# Patient Record
Sex: Male | Born: 1970 | Race: White | Hispanic: No | Smoking: Never smoker
Health system: Southern US, Community
[De-identification: ages and names within clinical notes are randomized; demographics above are authoritative.]

## PROBLEM LIST (undated history)

## (undated) DIAGNOSIS — N200 Calculus of kidney: Secondary | ICD-10-CM

## (undated) DIAGNOSIS — A159 Respiratory tuberculosis unspecified: Secondary | ICD-10-CM

## (undated) DIAGNOSIS — K219 Gastro-esophageal reflux disease without esophagitis: Secondary | ICD-10-CM

## (undated) DIAGNOSIS — D473 Essential (hemorrhagic) thrombocythemia: Secondary | ICD-10-CM

## (undated) DIAGNOSIS — G4730 Sleep apnea, unspecified: Secondary | ICD-10-CM

## (undated) DIAGNOSIS — N201 Calculus of ureter: Secondary | ICD-10-CM

## (undated) DIAGNOSIS — Z880 Allergy status to penicillin: Secondary | ICD-10-CM

## (undated) DIAGNOSIS — H269 Unspecified cataract: Secondary | ICD-10-CM

## (undated) DIAGNOSIS — K5730 Diverticulosis of large intestine without perforation or abscess without bleeding: Secondary | ICD-10-CM

## (undated) DIAGNOSIS — E785 Hyperlipidemia, unspecified: Secondary | ICD-10-CM

## (undated) DIAGNOSIS — I10 Essential (primary) hypertension: Secondary | ICD-10-CM

## (undated) DIAGNOSIS — F909 Attention-deficit hyperactivity disorder, unspecified type: Secondary | ICD-10-CM

## (undated) DIAGNOSIS — K5720 Diverticulitis of large intestine with perforation and abscess without bleeding: Secondary | ICD-10-CM

## (undated) HISTORY — PX: WISDOM TOOTH EXTRACTION: SHX21

## (undated) HISTORY — DX: Respiratory tuberculosis unspecified: A15.9

## (undated) HISTORY — PX: LITHOTRIPSY: SUR834

## (undated) HISTORY — DX: Hyperlipidemia, unspecified: E78.5

## (undated) HISTORY — DX: Gastro-esophageal reflux disease without esophagitis: K21.9

## (undated) HISTORY — PX: COLON SURGERY: SHX602

## (undated) HISTORY — DX: Unspecified cataract: H26.9

## (undated) HISTORY — DX: Sleep apnea, unspecified: G47.30

## (undated) HISTORY — PX: COLONOSCOPY: SHX174

---

## 2011-08-03 ENCOUNTER — Emergency Department (HOSPITAL_COMMUNITY)
Admission: EM | Admit: 2011-08-03 | Discharge: 2011-08-03 | Disposition: A | Discharge status: Home / Self Care | Payer: BC Managed Care – PPO | Attending: Emergency Medicine | Admitting: Emergency Medicine

## 2011-08-03 ENCOUNTER — Other Ambulatory Visit: Payer: Self-pay

## 2011-08-03 ENCOUNTER — Encounter (HOSPITAL_COMMUNITY): Payer: Self-pay | Admitting: *Deleted

## 2011-08-03 DIAGNOSIS — R109 Unspecified abdominal pain: Secondary | ICD-10-CM | POA: Insufficient documentation

## 2011-08-03 DIAGNOSIS — I10 Essential (primary) hypertension: Secondary | ICD-10-CM | POA: Insufficient documentation

## 2011-08-03 DIAGNOSIS — K5730 Diverticulosis of large intestine without perforation or abscess without bleeding: Secondary | ICD-10-CM | POA: Insufficient documentation

## 2011-08-03 DIAGNOSIS — R079 Chest pain, unspecified: Secondary | ICD-10-CM | POA: Insufficient documentation

## 2011-08-03 DIAGNOSIS — Z87442 Personal history of urinary calculi: Secondary | ICD-10-CM | POA: Insufficient documentation

## 2011-08-03 HISTORY — DX: Diverticulosis of large intestine without perforation or abscess without bleeding: K57.30

## 2011-08-03 HISTORY — DX: Essential (primary) hypertension: I10

## 2011-08-03 HISTORY — DX: Calculus of kidney: N20.0

## 2011-08-03 LAB — URINALYSIS, MICROSCOPIC ONLY
Leukocytes, UA: NEGATIVE
Nitrite: POSITIVE — AB
Specific Gravity, Urine: >1.03 — ABNORMAL HIGH (ref 1.005–1.030)
pH: 5 (ref 5.0–8.0)

## 2011-08-03 MED ORDER — SODIUM CHLORIDE 0.9 % IV BOLUS (SEPSIS)
1000.0000 mL | Freq: Once | INTRAVENOUS | Status: AC
  Administered 2011-08-03: 1000 mL via INTRAVENOUS

## 2011-08-03 MED ORDER — KETOROLAC TROMETHAMINE 30 MG/ML IJ SOLN
30.0000 mg | Freq: Once | INTRAMUSCULAR | Status: AC
  Administered 2011-08-03: 30 mg via INTRAVENOUS
  Filled 2011-08-03: qty 1

## 2011-08-03 MED ORDER — ONDANSETRON 4 MG PO TBDP: 4.0000 mg | ORAL_TABLET | Freq: Three times a day (TID) | ORAL | Status: AC | PRN

## 2011-08-03 MED ORDER — CEFDINIR 300 MG PO CAPS: 300.0000 mg | ORAL_CAPSULE | Freq: Two times a day (BID) | ORAL | Status: AC

## 2011-08-03 MED ORDER — HYDROMORPHONE HCL PF 1 MG/ML IJ SOLN
1.0000 mg | Freq: Once | INTRAMUSCULAR | Status: AC
  Administered 2011-08-03: 1 mg via INTRAVENOUS
  Filled 2011-08-03: qty 1

## 2011-08-03 NOTE — ED Provider Notes (Addendum)
History     CSN: [id number]  Arrival date & time 08/03/11  0124   First MD Initiated Contact with Patient 08/03/11 0201      Chief Complaint  Patient presents with  . 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 He also has a history of kidney stones including a right distal ureteral kidney stone that he states was 3 mm big and identified in February as well. He states that since that time over the last 3 months he has developed intermittent right-sided flank pain which is sharp and stabbing, radiates to the right lower quadrant and resolves by itself. It comes in waves, waxes and wanes in intensity, a sharp and stabbing and sometimes associated with nausea. He states that he does get some intermittent cystitis type symptoms related to the diverticulitis but has not had a urinary infection. He has not seen a urologist in this area but has been referred to a urologist in the ED and. He has not been seen in the office, has not had cystoscopy and has not had any lithotripsy for this kidney stone. He states that over the last several hours he has had recurrence of his pain on the right flank, it is typical for his kidney stone pain and he has not had medications nor nausea prior to arrival.  He also states that he has had intermittent chest pain when he gets stressed out, his stress level has been significantly increased lately at work and he states that he's been having intermittent chest pain related to stress. It is nonexertional positional or related to deep breathing or eating. Currently he does not have any chest pain. He does admit to having a history of SVT with stress as well as being in a junctional rhythm occasionally. He has no history of coronary  obstructive disease or other primary arrhythmia.  Patient is a 41 y.o. male presenting with flank pain. The history is provided by the patient.  Flank Pain    Past Medical History  Diagnosis Date  . Kidney calculi   . Diverticula of colon   . Hypertension     History reviewed. No pertinent past surgical history.  No family history on file.  History  Substance Use Topics  . Smoking status: Never Smoker   . Smokeless tobacco: Not on file  . Alcohol Use: No      Review of Systems  Genitourinary: Positive for flank pain.  All other systems reviewed and are negative.    Allergies  Penicillins  Home Medications   Current Outpatient Rx  Name Route Sig Dispense Refill  . AMPHETAMINE-DEXTROAMPHETAMINE 30 MG PO TABS Oral Take 30 mg by mouth 2 (two) times daily.    . CYCLOBENZAPRINE HCL 5 MG PO TABS Oral Take 5 mg by mouth 3 (three) times daily as needed.    Marland Kitchen DOCUSATE SODIUM 100 MG PO CAPS Oral Take 100 mg by mouth 2 (two) times daily as needed.    Marland Kitchen HYDROCODONE-ACETAMINOPHEN 10-500 MG PO TABS Oral Take 1 tablet by mouth every 6 (six) hours as needed.    Marland Kitchen LOSARTAN POTASSIUM 50 MG PO TABS Oral Take 50 mg by mouth daily.    Marland Kitchen OMEPRAZOLE 20 MG PO CPDR Oral Take 20 mg by mouth daily.    Marland Kitchen PERCOCET PO Oral Take by mouth.    Marland Kitchen  PROPRANOLOL HCL 80 MG PO TABS Oral Take 80 mg by mouth 2 (two) times daily.    Marland Kitchen TAMSULOSIN HCL 0.4 MG PO CAPS Oral Take 0.4 mg by mouth.    . CEFDINIR 300 MG PO CAPS Oral Take 1 capsule (300 mg total) by mouth 2 (two) times daily. 20 capsule 0  . ONDANSETRON 4 MG PO TBDP Oral Take 1 tablet (4 mg total) by mouth every 8 (eight) hours as needed for nausea. 10 tablet 0    BP 138/91  Pulse 83  Temp(Src) 97.6 F (36.4 C) (Oral)  Resp 16  Ht 6\' 4"  (1.93 m)  Wt 210 lb (95.255 kg)  BMI 25.56 kg/m2  SpO2 100%  Physical Exam  Nursing note and vitals reviewed. Constitutional: He appears well-developed and well-nourished. No distress.  HENT:  Head:  Normocephalic and atraumatic.  Mouth/Throat: Oropharynx is clear and moist. No oropharyngeal exudate.  Eyes: Conjunctivae and EOM are normal. Pupils are equal, round, and reactive to light. Right eye exhibits no discharge. Left eye exhibits no discharge. No scleral icterus.  Neck: Normal range of motion. Neck supple. No JVD present. No thyromegaly present.  Cardiovascular: Normal rate, regular rhythm, normal heart sounds and intact distal pulses.  Exam reveals no gallop and no friction rub.   No murmur heard. Pulmonary/Chest: Effort normal and breath sounds normal. No respiratory distress. He has no wheezes. He has no rales.  Abdominal: Soft. Bowel sounds are normal. He exhibits no distension and no mass. There is no tenderness.       Mild CVA tenderness on the right  Musculoskeletal: Normal range of motion. He exhibits no edema and no tenderness.  Lymphadenopathy:    He has no cervical adenopathy.  Neurological: He is alert. Coordination normal.  Skin: Skin is warm and dry. No rash noted. No erythema.  Psychiatric: He has a normal mood and affect. His behavior is normal.    ED Course  Procedures (including critical care time)  Labs Reviewed  URINALYSIS, WITH MICROSCOPIC - Abnormal; Notable for the following:    Color, Urine RED (*) BIOCHEMICALS MAY BE AFFECTED BY COLOR   Specific Gravity, Urine >1.030 (*)    Glucose, UA 250 (*)    Ketones, ur TRACE (*)    Protein, ur 100 (*)    Urobilinogen, UA >8.0 (*)    Nitrite POSITIVE (*)    Crystals CA OXALATE CRYSTALS (*)    All other components within normal limits   No results found.   1. Flank pain       MDM  The patient is well-appearing, he has vital signs that are normal, he has mild CVA tenderness but no other urinary symptoms. He admits to taking Pyridium and states that his urine has been slightly discolored since starting that. We'll check urinalysis for hematuria, infection, pain medication. He does not have a surgical  abdomen, has no tenderness in his abdomen and has no fevers chills nausea or vomiting.  Symptoms much improved after medicines - review of UA shows nitrite positive but rare bacteria and 0-2 WBC - culture sent - pt requests cephalosporin which he states he takes safely to treat possible diverticulitis flare -has f/u with GI in [place] and surgery in [place] - is well appearing at this time, normal VS, and tolerating PO.  Has percocet and Flomax at home, recommendations given, local Urology f/u recommended  ED ECG REPORT   Date: 08/03/2011   Rate: 75  Rhythm: normal sinus rhythm  QRS  Axis: normal  Intervals: normal  ST/T Wave abnormalities: early repolarization  Conduction Disutrbances:none  Narrative Interpretation:   Old EKG Reviewed: none available  Doubt cardiac problems given ECG    Discharge Prescriptions include:  zofran Cefdinir   Vida Roller, MD 08/03/11 1610  Vida Roller, MD 08/03/11 [phone]

## 2011-08-03 NOTE — ED Notes (Addendum)
Pt reports history of kidney stones, reports severe right flank pain as though stone is moving.   States that he did take one Percocet 10 about 11pm, with minor relief.  Pt also reporting history of diverticulitis and cystitis.  Also reporting cardiac history and tachycardia with severe stress and pain. HR normal at this time.  Dr. Hyacinth Meeker at bedside.

## 2011-08-03 NOTE — Discharge Instructions (Signed)
Call Dr. Jerre Simon today - take omnicef twice daily in case part of your symptoms are related to a flare of diverticulitis, your urine culture has been sent - you will be contacted if you need a different antibiotic  Return to the hospital for severe or worsening vomiting, fever, pain.

## 2011-08-03 NOTE — ED Notes (Signed)
Pt reports dx of kidney stone tonight pain in rt flank worse

## 2011-08-04 LAB — URINE CULTURE
Colony Count: NO GROWTH
Culture  Setup Time: 201305081112
Culture: NO GROWTH

## 2011-08-18 ENCOUNTER — Encounter (HOSPITAL_COMMUNITY): Payer: Self-pay | Admitting: Emergency Medicine

## 2011-08-18 ENCOUNTER — Emergency Department (HOSPITAL_COMMUNITY): Payer: BC Managed Care – PPO

## 2011-08-18 ENCOUNTER — Inpatient Hospital Stay (HOSPITAL_COMMUNITY)
Admission: EM | Admit: 2011-08-18 | Discharge: 2011-08-23 | DRG: 182 | Disposition: A | Discharge status: Other Acute Inpatient Hospital | Payer: BC Managed Care – PPO | Attending: Internal Medicine | Admitting: Internal Medicine

## 2011-08-18 DIAGNOSIS — F988 Other specified behavioral and emotional disorders with onset usually occurring in childhood and adolescence: Secondary | ICD-10-CM

## 2011-08-18 DIAGNOSIS — D6489 Other specified anemias: Secondary | ICD-10-CM | POA: Diagnosis present

## 2011-08-18 DIAGNOSIS — I10 Essential (primary) hypertension: Secondary | ICD-10-CM | POA: Diagnosis present

## 2011-08-18 DIAGNOSIS — K630 Abscess of intestine: Secondary | ICD-10-CM | POA: Diagnosis present

## 2011-08-18 DIAGNOSIS — K651 Peritoneal abscess: Secondary | ICD-10-CM

## 2011-08-18 DIAGNOSIS — K5732 Diverticulitis of large intestine without perforation or abscess without bleeding: Principal | ICD-10-CM | POA: Diagnosis present

## 2011-08-18 DIAGNOSIS — D473 Essential (hemorrhagic) thrombocythemia: Secondary | ICD-10-CM | POA: Diagnosis present

## 2011-08-18 DIAGNOSIS — Z8669 Personal history of other diseases of the nervous system and sense organs: Secondary | ICD-10-CM

## 2011-08-18 DIAGNOSIS — N201 Calculus of ureter: Secondary | ICD-10-CM | POA: Diagnosis present

## 2011-08-18 DIAGNOSIS — K4090 Unilateral inguinal hernia, without obstruction or gangrene, not specified as recurrent: Secondary | ICD-10-CM | POA: Diagnosis present

## 2011-08-18 DIAGNOSIS — E871 Hypo-osmolality and hyponatremia: Secondary | ICD-10-CM | POA: Diagnosis present

## 2011-08-18 DIAGNOSIS — K5792 Diverticulitis of intestine, part unspecified, without perforation or abscess without bleeding: Secondary | ICD-10-CM

## 2011-08-18 DIAGNOSIS — G43909 Migraine, unspecified, not intractable, without status migrainosus: Secondary | ICD-10-CM | POA: Diagnosis present

## 2011-08-18 DIAGNOSIS — N1330 Unspecified hydronephrosis: Secondary | ICD-10-CM | POA: Diagnosis present

## 2011-08-18 DIAGNOSIS — K5720 Diverticulitis of large intestine with perforation and abscess without bleeding: Secondary | ICD-10-CM

## 2011-08-18 DIAGNOSIS — Z880 Allergy status to penicillin: Secondary | ICD-10-CM

## 2011-08-18 DIAGNOSIS — R1032 Left lower quadrant pain: Secondary | ICD-10-CM | POA: Diagnosis present

## 2011-08-18 DIAGNOSIS — R300 Dysuria: Secondary | ICD-10-CM | POA: Diagnosis present

## 2011-08-18 HISTORY — DX: Calculus of ureter: N20.1

## 2011-08-18 HISTORY — DX: Diverticulitis of large intestine with perforation and abscess without bleeding: K57.20

## 2011-08-18 HISTORY — DX: Attention-deficit hyperactivity disorder, unspecified type: F90.9

## 2011-08-18 HISTORY — DX: Allergy status to penicillin: Z88.0

## 2011-08-18 HISTORY — DX: Essential (hemorrhagic) thrombocythemia: D47.3

## 2011-08-18 LAB — CBC
MCH: 28.1 pg (ref 26.0–34.0)
Platelets: 704 10*3/uL — ABNORMAL HIGH (ref 150–400)
RBC: 4.73 MIL/uL (ref 4.22–5.81)
RDW: 13.7 % (ref 11.5–15.5)
WBC: 15.8 10*3/uL — ABNORMAL HIGH (ref 4.0–10.5)

## 2011-08-18 LAB — BASIC METABOLIC PANEL
Calcium: 10.1 mg/dL (ref 8.4–10.5)
GFR calc Af Amer: 84 mL/min — ABNORMAL LOW (ref >90)
GFR calc non Af Amer: 72 mL/min — ABNORMAL LOW (ref >90)
Glucose, Bld: 107 mg/dL — ABNORMAL HIGH (ref 70–99)
Potassium: 4.1 mEq/L (ref 3.5–5.1)
Sodium: 135 mEq/L (ref 135–145)

## 2011-08-18 LAB — URINALYSIS, ROUTINE W REFLEX MICROSCOPIC
Leukocytes, UA: NEGATIVE
Nitrite: NEGATIVE
Protein, ur: NEGATIVE mg/dL
Specific Gravity, Urine: 1.015 (ref 1.005–1.030)
Urobilinogen, UA: 0.2 mg/dL (ref 0.0–1.0)

## 2011-08-18 LAB — DIFFERENTIAL
Basophils Absolute: 0 10*3/uL (ref 0.0–0.1)
Eosinophils Absolute: 0.4 10*3/uL (ref 0.0–0.7)
Lymphs Abs: 3.7 10*3/uL (ref 0.7–4.0)
Neutrophils Relative %: 67 % (ref 43–77)

## 2011-08-18 LAB — URINE MICROSCOPIC-ADD ON

## 2011-08-18 LAB — HEPATIC FUNCTION PANEL
Bilirubin, Direct: <0.1 mg/dL (ref 0.0–0.3)
Total Bilirubin: 0.3 mg/dL (ref 0.3–1.2)

## 2011-08-18 LAB — LIPASE, BLOOD: Lipase: 29 U/L (ref 11–59)

## 2011-08-18 MED ORDER — METRONIDAZOLE IN NACL 5-0.79 MG/ML-% IV SOLN
500.0000 mg | Freq: Once | INTRAVENOUS | Status: AC
  Administered 2011-08-18: 500 mg via INTRAVENOUS
  Filled 2011-08-18: qty 100

## 2011-08-18 MED ORDER — HYDROMORPHONE HCL PF 1 MG/ML IJ SOLN
1.0000 mg | INTRAMUSCULAR | Status: DC | PRN
  Administered 2011-08-18: 1 mg via INTRAVENOUS
  Filled 2011-08-18: qty 1

## 2011-08-18 MED ORDER — HYDROMORPHONE HCL PF 1 MG/ML IJ SOLN
1.0000 mg | Freq: Once | INTRAMUSCULAR | Status: AC
  Administered 2011-08-18: 1 mg via INTRAVENOUS
  Filled 2011-08-18: qty 1

## 2011-08-18 MED ORDER — ONDANSETRON HCL 4 MG/2ML IJ SOLN
4.0000 mg | Freq: Four times a day (QID) | INTRAMUSCULAR | Status: DC | PRN
  Administered 2011-08-19 – 2011-08-23 (×10): 4 mg via INTRAVENOUS
  Filled 2011-08-18 (×10): qty 2

## 2011-08-18 MED ORDER — ONDANSETRON HCL 4 MG/2ML IJ SOLN
4.0000 mg | Freq: Once | INTRAMUSCULAR | Status: AC
  Administered 2011-08-18: 4 mg via INTRAVENOUS
  Filled 2011-08-18: qty 2

## 2011-08-18 MED ORDER — CEFTRIAXONE SODIUM 1 G IJ SOLR
1.0000 g | INTRAMUSCULAR | Status: DC
  Administered 2011-08-18 – 2011-08-22 (×5): 1 g via INTRAVENOUS
  Filled 2011-08-18 (×5): qty 10

## 2011-08-18 MED ORDER — POTASSIUM CHLORIDE IN NACL 20-0.9 MEQ/L-% IV SOLN
INTRAVENOUS | Status: DC
  Administered 2011-08-18 – 2011-08-21 (×6): via INTRAVENOUS

## 2011-08-18 MED ORDER — SODIUM CHLORIDE 0.9 % IV SOLN: INTRAVENOUS | Status: DC

## 2011-08-18 MED ORDER — TAMSULOSIN HCL 0.4 MG PO CAPS
0.4000 mg | ORAL_CAPSULE | Freq: Every day | ORAL | Status: DC
  Administered 2011-08-18 – 2011-08-22 (×5): 0.4 mg via ORAL
  Filled 2011-08-18 (×6): qty 1

## 2011-08-18 MED ORDER — ONDANSETRON HCL 4 MG PO TABS
4.0000 mg | ORAL_TABLET | Freq: Four times a day (QID) | ORAL | Status: DC | PRN
  Administered 2011-08-23: 4 mg via ORAL
  Filled 2011-08-18: qty 1

## 2011-08-18 MED ORDER — CIPROFLOXACIN IN D5W 400 MG/200ML IV SOLN
400.0000 mg | Freq: Once | INTRAVENOUS | Status: AC
  Administered 2011-08-18: 400 mg via INTRAVENOUS
  Filled 2011-08-18: qty 200

## 2011-08-18 MED ORDER — PROPRANOLOL HCL 20 MG PO TABS
80.0000 mg | ORAL_TABLET | Freq: Two times a day (BID) | ORAL | Status: DC
  Administered 2011-08-18 – 2011-08-21 (×3): 80 mg via ORAL
  Filled 2011-08-18 (×4): qty 4
  Filled 2011-08-18: qty 1

## 2011-08-18 MED ORDER — SODIUM CHLORIDE 0.9 % IV BOLUS (SEPSIS)
1000.0000 mL | Freq: Once | INTRAVENOUS | Status: AC
  Administered 2011-08-18: 1000 mL via INTRAVENOUS

## 2011-08-18 MED ORDER — ALUM & MAG HYDROXIDE-SIMETH 200-200-20 MG/5ML PO SUSP: 30.0000 mL | Freq: Four times a day (QID) | ORAL | Status: DC | PRN

## 2011-08-18 MED ORDER — ONDANSETRON HCL 4 MG/2ML IJ SOLN
4.0000 mg | Freq: Three times a day (TID) | INTRAMUSCULAR | Status: DC | PRN
  Administered 2011-08-18: 4 mg via INTRAVENOUS
  Filled 2011-08-18: qty 2

## 2011-08-18 MED ORDER — HEPARIN SODIUM (PORCINE) 5000 UNIT/ML IJ SOLN
5000.0000 [IU] | Freq: Three times a day (TID) | INTRAMUSCULAR | Status: DC
  Administered 2011-08-18 – 2011-08-19 (×3): 5000 [IU] via SUBCUTANEOUS
  Filled 2011-08-18 (×3): qty 1

## 2011-08-18 MED ORDER — IBUPROFEN 800 MG PO TABS: 800.0000 mg | ORAL_TABLET | Freq: Once | ORAL | Status: DC

## 2011-08-18 MED ORDER — IOHEXOL 300 MG/ML  SOLN
100.0000 mL | Freq: Once | INTRAMUSCULAR | Status: AC | PRN
  Administered 2011-08-18: 100 mL via INTRAVENOUS

## 2011-08-18 MED ORDER — HYDROMORPHONE HCL PF 1 MG/ML IJ SOLN
1.0000 mg | INTRAMUSCULAR | Status: DC | PRN
  Administered 2011-08-18 – 2011-08-19 (×4): 1 mg via INTRAVENOUS
  Filled 2011-08-18 (×4): qty 1

## 2011-08-18 MED ORDER — METRONIDAZOLE IN NACL 5-0.79 MG/ML-% IV SOLN
500.0000 mg | Freq: Three times a day (TID) | INTRAVENOUS | Status: DC
  Administered 2011-08-18 – 2011-08-23 (×14): 500 mg via INTRAVENOUS
  Filled 2011-08-18 (×16): qty 100

## 2011-08-18 MED ORDER — ACETAMINOPHEN 650 MG RE SUPP: 650.0000 mg | Freq: Four times a day (QID) | RECTAL | Status: DC | PRN

## 2011-08-18 MED ORDER — ACETAMINOPHEN 325 MG PO TABS
650.0000 mg | ORAL_TABLET | Freq: Four times a day (QID) | ORAL | Status: DC | PRN
  Administered 2011-08-19 – 2011-08-23 (×6): 650 mg via ORAL
  Filled 2011-08-18 (×6): qty 2

## 2011-08-18 NOTE — H&P (Addendum)
Hospital Admission Note Date: 08/18/2011  Patient name: Wesley Henson Medical record number: [id number] Date of birth: 21-Dec-1970 Age: 41 y.o. Gender: male PCP: Maximiano Coss, MD, MD  Attending physician: Christiane Ha, MD  Chief Complaint:   History of Present Illness: LLQ pain  Wesley Henson is an 41 y.o. male with history of diverticulitis diagnosed in January who presents with left lower quadrant pain. He had outpatient treatment of diverticulitis in January with ciprofloxacin and metronidazole. He was subsequently admitted to [facility] with complicated diverticulitis. He had abscess formation and was treated medically. He received IV ciprofloxacin and Flagyl in the hospital, went home with a PICC line, had IV ciprofloxacin and Flagyl at home which was subsequently changed to Tygacil. He never completely recovered and had mild abdominal pain since then. However, his left lower cord or pain has become much more severe. His appetite has been poor the entire time and he's lost about 40 pounds. He has sometimes blood-tinged stool with mucus. His chronic hemorrhoids and feels that the blood is related to this. He was evaluated by the surgeon at [facility]. He went to [facility] to see a gastroenterologist who agreed with management as far. Patient has had no nausea or vomiting. He has had several CAT scans since January. Today's CAT scan of the abdomen and pelvis shows diverticulitis with 2 small abscess these versus one communicating abscess. I spoke with Dr. Juan Quam who compared the CAT scan to one done at [facility]. The location of the abscess about the same, but the size is a bit bigger since March. There is no free air. The abscess he has are too small and poorly positioned for percutaneous drainage according to radiology. The patient was hoping to avoid surgery. Prior to January, he has never had diverticulitis. He's never had a colonoscopy.    Also, he reports aggressive activities with an adult toy with his husband in December, and wonders whether this may have contributed. He also reports protected sex with a different partner last fall, and is concerned about HIV or other STDs. He tested negative for HIV last September.  In addition, he has a history of nephrolithiasis and had lithotripsy over a week ago in [place]. He has a followup appointment scheduled next week with his urologist.  Past Medical History  Diagnosis Date  . Kidney calculi   . Diverticula of colon   . Hypertension    ADD History of migraines  Meds: Prescriptions prior to admission  Medication Sig Dispense Refill  . amphetamine-dextroamphetamine (ADDERALL) 30 MG tablet Take 30 mg by mouth 2 (two) times daily.      . cyclobenzaprine (FLEXERIL) 5 MG tablet Take 5 mg by mouth 3 (three) times daily as needed. Muscle spasm      . docusate sodium (COLACE) 100 MG capsule Take 100 mg by mouth 2 (two) times daily as needed. constipation      . HYDROcodone-acetaminophen (LORTAB) 10-500 MG per tablet Take 1 tablet by mouth every 6 (six) hours as needed. pain      . losartan (COZAAR) 50 MG tablet Take 50 mg by mouth daily.      . mesalamine (ASACOL) 400 MG EC tablet Take 800 mg by mouth daily.      Marland Kitchen omeprazole (PRILOSEC) 20 MG capsule Take 20 mg by mouth daily.      Marland Kitchen oxyCODONE-acetaminophen (PERCOCET) 10-325 MG per tablet Take 1 tablet by mouth every 6 (six) hours as needed. pain      .  Probiotic Product (PHILLIPS COLON HEALTH) CAPS Take 1 capsule by mouth daily.      . propranolol (INDERAL) 80 MG tablet Take 80 mg by mouth 2 (two) times daily.      . Tamsulosin HCl (FLOMAX) 0.4 MG CAPS Take 0.4 mg by mouth daily.         Allergies: Penicillins, anaphylaxis  Social History: Homosexual, denies heavy drinking or drugs or alcohol  History reviewed. No pertinent family history. Past Surgical History  Procedure Date  . Mouth surgery   . Lithotripsy      Review of Systems: Gen.: As above. No fevers or chills HEENT no headache recently no sore throat swallowing difficulties thrush Respiratory no cough or shortness of breath Cardiovascular no chest pains or palpitations GI as above  GU: He reports frequent dysuria. No genital lesions or purulent urethral drainage. He reports he has to wear depends at work because of her urge incontinence since January Hematologic no history of bleeding or blood clots Endocrine no diabetes Skin no rash Neurologic no history of seizures Psychiatric no depression Musculoskeletal no arthralgias or myalgias  Physical Exam: Blood pressure 136/91, pulse 88, temperature 97.8 F (36.6 C), temperature source Oral, resp. rate 18, height 6\' 4"  (1.93 m), weight 93.895 kg (207 lb), SpO2 97.00%.  BP 136/91  Pulse 88  Temp(Src) 97.8 F (36.6 C) (Oral)  Resp 18  Ht 6\' 4"  (1.93 m)  Wt 93.895 kg (207 lb)  BMI 25.20 kg/m2  SpO2 97%  General Appearance:    Alert, cooperative, no distress, appears stated age  Head:    Normocephalic, without obvious abnormality, atraumatic  Eyes:    PERRL, conjunctiva/corneas clear, EOM's intact, fundi    benign, both eyes       Ears:    Normal TM's and external ear canals, both ears  Nose:   Nares normal, septum midline, mucosa normal, no drainage   or sinus tenderness  Throat:   Lips, mucosa, and tongue normal; teeth and gums normal  Neck:   Supple, symmetrical, trachea midline, no adenopathy;       thyroid:  No enlargement/tenderness/nodules; no carotid   bruit or JVD  Back:     Symmetric, no curvature, ROM normal, right-sided CVA tenderness present   Lungs:     Clear to auscultation bilaterally, respirations unlabored  Chest wall:    No tenderness or deformity  Heart:    Regular rate and rhythm, S1 and S2 normal, no murmur, rub   or gallop  Abdomen:     Soft, decreased bowel sounds. Left lower quadrant tenderness. Mild right sided abdominal tenderness. No rebound  tenderness   Genitalia:   deferred   Rectal:   deferred   Extremities:   Extremities normal, atraumatic, no cyanosis or edema  Pulses:   2+ and symmetric all extremities  Skin:   Skin color, texture, turgor normal, no rashes or lesions  Lymph nodes:   Cervical, supraclavicular, and axillary nodes normal  Neurologic:   CNII-XII intact. Normal strength, sensation and reflexes      throughout    Psychiatric: normal affect  Lab results: Basic Metabolic Panel:  Basename 08/18/11 0714  NA 135  K 4.1  CL 98  CO2 27  GLUCOSE 107*  BUN 11  CREATININE 1.21  CALCIUM 10.1  MG --  PHOS --   Liver Function Tests:  [facility] 08/18/11 0714  AST 30  ALT 24  ALKPHOS 103  BILITOT 0.3  PROT 8.6*  ALBUMIN 4.2  Basename 08/18/11 0714  LIPASE 29  AMYLASE --   No results found for this basename: AMMONIA:2 in the last 72 hours CBC:  Basename 08/18/11 0714  WBC 15.8*  NEUTROABS 10.6*  HGB 13.3  HCT 40.7  MCV 86.0  PLT 704*   Cardiac Enzymes: No results found for this basename: CKTOTAL:3,CKMB:3,CKMBINDEX:3,TROPONINI:3 in the last 72 hours BNP: No results found for this basename: PROBNP:3 in the last 72 hours D-Dimer: No results found for this basename: DDIMER:2 in the last 72 hours CBG: No results found for this basename: GLUCAP:6 in the last 72 hours Hemoglobin A1C: No results found for this basename: HGBA1C in the last 72 hours Fasting Lipid Panel: No results found for this basename: CHOL,HDL,LDLCALC,TRIG,CHOLHDL,LDLDIRECT in the last 72 hours Thyroid Function Tests: No results found for this basename: TSH,T4TOTAL,FREET4,T3FREE,THYROIDAB in the last 72 hours Anemia Panel: No results found for this basename: VITAMINB12,FOLATE,FERRITIN,TIBC,IRON,RETICCTPCT in the last 72 hours Coagulation: No results found for this basename: LABPROT:2,INR:2 in the last 72 hours Urine Drug Screen: Drugs of Abuse  No results found for this basename: labopia, cocainscrnur, labbenz,  amphetmu, thcu, labbarb    Alcohol Level: No results found for this basename: ETH:2 in the last 72 hours Urinalysis:  Basename 08/18/11 0715  COLORURINE YELLOW  LABSPEC 1.015  PHURINE 6.0  GLUCOSEU NEGATIVE  HGBUR TRACE*  BILIRUBINUR NEGATIVE  KETONESUR NEGATIVE  PROTEINUR NEGATIVE  UROBILINOGEN 0.2  NITRITE NEGATIVE  LEUKOCYTESUR NEGATIVE    Imaging results:  Ct Abdomen Pelvis W Contrast  08/18/2011  *RADIOLOGY REPORT*  Clinical Data: Left side abdominal pain, history diverticulitis and kidney stones  CT ABDOMEN AND PELVIS WITH CONTRAST  Technique:  Multidetector CT imaging of the abdomen and pelvis was performed following the standard protocol during bolus administration of intravenous contrast. Sagittal and coronal MPR images reconstructed from axial data set.  Contrast: OMNIPAQUE IOHEXOL 300 MG/ML  SOLN. Dilute oral contrast.  Comparison: None Correlation:  Abdominal radiographs 08/18/2011  Findings: Minimal subpleural atelectasis at lung bases. Mild right hydronephrosis and hydroureter with slight delay in right nephrogram versus left. Two right ureteral calculi are identified: 3 mm diameter distal right ureter image 71 and at right ureterovesicle junction 2 mm diameter image 76. No additional urinary tract calcifications or left side hydronephrosis/hydroureter. Liver, spleen, pancreas, kidneys, and adrenal glands otherwise normal appearance.  Significant wall thickening of the sigmoid colon with pericolic inflammatory changes of the sigmoid mesocolon compatible with acute diverticulitis. Two small extraluminal gas and fluid collections are seen in the pelvis, potentially communicating, compatible with diverticular abscesses, larger measuring 3.8 x 2.1 x 2.5 cm image 75, smaller measuring 1.4 x 1.4 x 1.7 cm image 71. Extensive pericolic inflammation extends into the left internal inguinal ring with a left inguinal hernia containing fat. Low attenuation is seen within the left  inguinal canal, 4 cm greatest diameter, question free fluid, hydrocele, cannot exclude infection.  Dilated right internal inguinal ring without inflammation or bowel herniation. Questionable wall thickening of the rectum versus additional inflammation or collapsed state. Stomach and remaining bowel loops normal appearance. No definite free intraperitoneal air identified. No mass, adenopathy, or acute osseous findings.  IMPRESSION: Right hydronephrosis and hydroureter secondary to 2 distal right ureteral calculi as above. Acute sigmoid diverticulitis with extensive pericolic inflammatory changes and evidence of two small versus single communicating diverticular abscess collections. Extension of inflammatory changes and fluid along a visualized fat containing left inguinal hernia into the left inguinal canal.  Findings called to Dr. Adriana Simas on 08/18/2011 at  1130 hours.  Original Report Authenticated By: Lollie Marrow, M.D.   Dg Abd Acute W/chest  08/18/2011  *RADIOLOGY REPORT*  Clinical Data: Left side abdominal pain, history kidney stones with lithotripsy, history diverticulitis  ACUTE ABDOMEN SERIES (ABDOMEN 2 VIEW & CHEST 1 VIEW)  Comparison: None  Findings: Normal heart size, mediastinal contours, and pulmonary vascularity. Lungs appear slightly emphysematous without infiltrate or effusion. Questionable nodular density versus nipple shadow 11 x 9 mm right lung base. No pneumothorax. Gas and stool throughout colon. Air filled loops of small bowel in the left mid abdomen, slightly prominent size up to 4.5 cm diameter, nonspecific. No definite bowel wall thickening or free intraperitoneal air. Scattered pelvic phleboliths. No definite urinary tract calcification. No acute osseous findings.  IMPRESSION: Question right nipple shadow versus right lung base nodule; recommend repeat PA chest radiograph with nipple markers to exclude pulmonary nodule. Nonspecific air filled mildly distended loops of small bowel wall in  left mid abdomen, though gas and stool are present throughout colon. Findings could represent ileus though early or partial small bowel obstruction not completely excluded. No other acute intra abdominal findings identified.  Original Report Authenticated By: Lollie Marrow, M.D.    Assessment & Plan: Active Problems:  Diverticulitis of large intestine with perforation colon/contained abcess. Patient has had a smoldering, waxing and waning course since January. He has already had several courses of Cipro and Flagyl. He has an anaphylactoid reaction to penicillin, but has tolerated cephalosporins. I will give Rocephin and Flagyl. Clear liquid diet. I will consult GI for recommendations. The abscesses are small and not amenable to percutaneous drainage. Patient may eventually require surgery but patient would like to avoid it if at all possible. Recheck HIV   Benign hypertension: Patient reports hypotension during his previous hospitalization. Will hold ARB for now.  Ureterolithiasis, status post lithotripsy 8 days ago. Continue Flomax. Patient has a followup visit with his urologist next week.   Hydronephrosis   History of migraine: Continue propranolol as long as blood pressure will allow.   ADD (attention deficit disorder): Will hold Adderall while in house.  Dysuria: May be related to the stones, urinalysis negative for infection. Will check urine chlamydia and GC probe.   Denni France L 08/18/2011, 6:47 PM

## 2011-08-18 NOTE — ED Notes (Signed)
Pt c/o left sided abd pain that started last night and has gotten progressively worse. Pt states he has a hx of diverticulitis.

## 2011-08-18 NOTE — ED Notes (Signed)
Attempted to call report

## 2011-08-18 NOTE — ED Provider Notes (Signed)
History   This chart was scribed for Donnetta Hutching, MD by Clarita Crane. The patient was seen in room APA12/AP[facility]12. Patient's care was started at 0645.    CSN: [id number]  Arrival date & time 08/18/11  4098   First MD Initiated Contact with Patient 08/18/11 [id number]      Chief Complaint  Patient presents with  . Abdominal Pain    (Consider location/radiation/quality/duration/timing/severity/associated sxs/prior treatment) HPI Wesley Henson is a 41 y.o. male who presents to the Emergency Department complaining of waxing and waning moderate to severe LLQ and left suprapubic abdominal pain onset last night and gradually worsening since. Describes pain as similar to that experienced with diverticulosis previously and rates the pain a 7 out of 10 currently. States diverticulosis was first diagnosed 3 months ago. Denies vomiting, diarrhea, fever, chills, chest pain, SOB, hematochezia. Patient reports having lithotripsy performed 8 days ago. Notes having 3 CT scans performed within the past 3 months. Patient with h/o HTN, kidney caliculi, diverticula of colon.   PCP- [place]  Past Medical History  Diagnosis Date  . Kidney calculi   . Diverticula of colon   . Hypertension     Past Surgical History  Procedure Date  . Mouth surgery   . Lithotripsy     History reviewed. No pertinent family history.  History  Substance Use Topics  . Smoking status: Never Smoker   . Smokeless tobacco: Not on file  . Alcohol Use: No  -Employed as a nurse    Review of Systems A complete 10 system review of systems was obtained and all systems are negative except as noted in the HPI and PMH.   Allergies  Penicillins  Home Medications   Current Outpatient Rx  Name Route Sig Dispense Refill  . AMPHETAMINE-DEXTROAMPHETAMINE 30 MG PO TABS Oral Take 30 mg by mouth 2 (two) times daily.    . CYCLOBENZAPRINE HCL 5 MG PO TABS Oral Take 5 mg by mouth 3 (three) times daily as needed. Muscle spasm    .  DOCUSATE SODIUM 100 MG PO CAPS Oral Take 100 mg by mouth 2 (two) times daily as needed. constipation    . HYDROCODONE-ACETAMINOPHEN 10-500 MG PO TABS Oral Take 1 tablet by mouth every 6 (six) hours as needed. pain    . LOSARTAN POTASSIUM 50 MG PO TABS Oral Take 50 mg by mouth daily.    Marland Kitchen MESALAMINE 400 MG PO TBEC Oral Take 800 mg by mouth daily.    Marland Kitchen OMEPRAZOLE 20 MG PO CPDR Oral Take 20 mg by mouth daily.    . OXYCODONE-ACETAMINOPHEN 10-325 MG PO TABS Oral Take 1 tablet by mouth every 6 (six) hours as needed. pain    . PHILLIPS COLON HEALTH PO CAPS Oral Take 1 capsule by mouth daily.    Marland Kitchen PROPRANOLOL HCL 80 MG PO TABS Oral Take 80 mg by mouth 2 (two) times daily.    Marland Kitchen TAMSULOSIN HCL 0.4 MG PO CAPS Oral Take 0.4 mg by mouth daily.       BP 120/81  Pulse 90  Temp(Src) 98.6 F (37 C) (Oral)  Resp 16  Ht 6\' 4"  (1.93 m)  Wt 207 lb (93.895 kg)  BMI 25.20 kg/m2  SpO2 99%  Physical Exam  Nursing note and vitals reviewed. Constitutional: He is oriented to person, place, and time. He appears well-developed and well-nourished. No distress.  HENT:  Head: Normocephalic and atraumatic.  Eyes: EOM are normal. Pupils are equal, round, and reactive to light.  Neck: Neck supple. No tracheal deviation present.  Cardiovascular: Normal rate and regular rhythm.  Exam reveals no gallop and no friction rub.   No murmur heard. Pulmonary/Chest: Effort normal. No respiratory distress. He has no wheezes. He has no rales.  Abdominal: Soft. He exhibits no distension. There is tenderness (left suprapubic/LLQ ).  Musculoskeletal: Normal range of motion. He exhibits no edema.  Neurological: He is alert and oriented to person, place, and time. No sensory deficit.  Skin: Skin is warm and dry.  Psychiatric: He has a normal mood and affect. His behavior is normal.    ED Course  Procedures (including critical care time)  DIAGNOSTIC STUDIES: Oxygen Saturation is 100% on room air, normal by my interpretation.     COORDINATION OF CARE: 7:30AM- Patient informed of current plan for treatment and evaluation and agrees with plan at this time. Administer IVFs, pain medication and Cipro/Flagyland obtain acute abdominal series, UA and blood labs. 7:43AM- Consult complete with radiologist regarding possibility of obtaining MRI as opposed to CT Scan in an effort to limit radiation exposure. Radiologist recommends CT scan as MRI would be an inferior study for evaluation in this situation.    Labs Reviewed  CBC - Abnormal; Notable for the following:    WBC 15.8 (*)    Platelets 704 (*)    All other components within normal limits  DIFFERENTIAL - Abnormal; Notable for the following:    Neutro Abs 10.6 (*)    All other components within normal limits  BASIC METABOLIC PANEL - Abnormal; Notable for the following:    Glucose, Bld 107 (*)    GFR calc non Af Amer 72 (*)    GFR calc Af Amer 84 (*)    All other components within normal limits  URINALYSIS, ROUTINE W REFLEX MICROSCOPIC - Abnormal; Notable for the following:    Hgb urine dipstick TRACE (*)    All other components within normal limits  HEPATIC FUNCTION PANEL - Abnormal; Notable for the following:    Total Protein 8.6 (*)    All other components within normal limits  URINE MICROSCOPIC-ADD ON  LIPASE, BLOOD   Ct Abdomen Pelvis W Contrast  08/18/2011  *RADIOLOGY REPORT*  Clinical Data: Left side abdominal pain, history diverticulitis and kidney stones  CT ABDOMEN AND PELVIS WITH CONTRAST  Technique:  Multidetector CT imaging of the abdomen and pelvis was performed following the standard protocol during bolus administration of intravenous contrast. Sagittal and coronal MPR images reconstructed from axial data set.  Contrast: OMNIPAQUE IOHEXOL 300 MG/ML  SOLN. Dilute oral contrast.  Comparison: None Correlation:  Abdominal radiographs 08/18/2011  Findings: Minimal subpleural atelectasis at lung bases. Mild right hydronephrosis and hydroureter with  slight delay in right nephrogram versus left. Two right ureteral calculi are identified: 3 mm diameter distal right ureter image 71 and at right ureterovesicle junction 2 mm diameter image 76. No additional urinary tract calcifications or left side hydronephrosis/hydroureter. Liver, spleen, pancreas, kidneys, and adrenal glands otherwise normal appearance.  Significant wall thickening of the sigmoid colon with pericolic inflammatory changes of the sigmoid mesocolon compatible with acute diverticulitis. Two small extraluminal gas and fluid collections are seen in the pelvis, potentially communicating, compatible with diverticular abscesses, larger measuring 3.8 x 2.1 x 2.5 cm image 75, smaller measuring 1.4 x 1.4 x 1.7 cm image 71. Extensive pericolic inflammation extends into the left internal inguinal ring with a left inguinal hernia containing fat. Low attenuation is seen within the left inguinal canal, 4  cm greatest diameter, question free fluid, hydrocele, cannot exclude infection.  Dilated right internal inguinal ring without inflammation or bowel herniation. Questionable wall thickening of the rectum versus additional inflammation or collapsed state. Stomach and remaining bowel loops normal appearance. No definite free intraperitoneal air identified. No mass, adenopathy, or acute osseous findings.  IMPRESSION: Right hydronephrosis and hydroureter secondary to 2 distal right ureteral calculi as above. Acute sigmoid diverticulitis with extensive pericolic inflammatory changes and evidence of two small versus single communicating diverticular abscess collections. Extension of inflammatory changes and fluid along a visualized fat containing left inguinal hernia into the left inguinal canal.  Findings called to Dr. Adriana Simas on 08/18/2011 at 1130 hours.  Original Report Authenticated By: Lollie Marrow, M.D.   Dg Abd Acute W/chest  08/18/2011  *RADIOLOGY REPORT*  Clinical Data: Left side abdominal pain, history kidney  stones with lithotripsy, history diverticulitis  ACUTE ABDOMEN SERIES (ABDOMEN 2 VIEW & CHEST 1 VIEW)  Comparison: None  Findings: Normal heart size, mediastinal contours, and pulmonary vascularity. Lungs appear slightly emphysematous without infiltrate or effusion. Questionable nodular density versus nipple shadow 11 x 9 mm right lung base. No pneumothorax. Gas and stool throughout colon. Air filled loops of small bowel in the left mid abdomen, slightly prominent size up to 4.5 cm diameter, nonspecific. No definite bowel wall thickening or free intraperitoneal air. Scattered pelvic phleboliths. No definite urinary tract calcification. No acute osseous findings.  IMPRESSION: Question right nipple shadow versus right lung base nodule; recommend repeat PA chest radiograph with nipple markers to exclude pulmonary nodule. Nonspecific air filled mildly distended loops of small bowel wall in left mid abdomen, though gas and stool are present throughout colon. Findings could represent ileus though early or partial small bowel obstruction not completely excluded. No other acute intra abdominal findings identified.  Original Report Authenticated By: Lollie Marrow, M.D.     No diagnosis found.    MDM  CT scan shows sigmoid diverticulitis with satellite abscesses.  Rx IV Cipro, IV Flagyl, IV narcotics.  Hydration. Discussed with patient. Admit to general medicine.      I personally performed the services described in this documentation, which was scribed in my presence. The recorded information has been reviewed and considered.    Donnetta Hutching, MD 08/18/11 1332

## 2011-08-19 ENCOUNTER — Encounter (HOSPITAL_COMMUNITY): Payer: Self-pay | Admitting: Gastroenterology

## 2011-08-19 DIAGNOSIS — K630 Abscess of intestine: Secondary | ICD-10-CM

## 2011-08-19 DIAGNOSIS — K5732 Diverticulitis of large intestine without perforation or abscess without bleeding: Secondary | ICD-10-CM

## 2011-08-19 DIAGNOSIS — I10 Essential (primary) hypertension: Secondary | ICD-10-CM

## 2011-08-19 DIAGNOSIS — F988 Other specified behavioral and emotional disorders with onset usually occurring in childhood and adolescence: Secondary | ICD-10-CM

## 2011-08-19 DIAGNOSIS — K651 Peritoneal abscess: Secondary | ICD-10-CM

## 2011-08-19 MED ORDER — PRO-STAT SUGAR FREE PO LIQD
30.0000 mL | Freq: Two times a day (BID) | ORAL | Status: DC
  Administered 2011-08-19 – 2011-08-22 (×5): 30 mL via ORAL
  Filled 2011-08-19 (×7): qty 30

## 2011-08-19 MED ORDER — BOOST / RESOURCE BREEZE PO LIQD
1.0000 | Freq: Two times a day (BID) | ORAL | Status: DC
  Administered 2011-08-19 – 2011-08-22 (×5): 1 via ORAL
  Filled 2011-08-19 (×9): qty 1

## 2011-08-19 MED ORDER — ENOXAPARIN SODIUM 40 MG/0.4ML ~~LOC~~ SOLN
40.0000 mg | SUBCUTANEOUS | Status: DC
  Administered 2011-08-19 – 2011-08-22 (×4): 40 mg via SUBCUTANEOUS
  Filled 2011-08-19 (×4): qty 0.4

## 2011-08-19 MED ORDER — HYDROMORPHONE HCL PF 1 MG/ML IJ SOLN
2.0000 mg | INTRAMUSCULAR | Status: DC | PRN
  Administered 2011-08-19 – 2011-08-20 (×9): 2 mg via INTRAVENOUS
  Filled 2011-08-19 (×4): qty 2
  Filled 2011-08-19 (×2): qty 1
  Filled 2011-08-19: qty 2
  Filled 2011-08-19: qty 1
  Filled 2011-08-19: qty 2
  Filled 2011-08-19: qty 1

## 2011-08-19 NOTE — Progress Notes (Signed)
Subjective: Pain is still quite severe. Better with 2 mg of Dilaudid. Had a bowel movement today, nonbloody. Tolerating clear liquids. Would like to stop the heparin injections, but is willing to switch to once daily Lovenox. No right flank pain.  Objective: Vital signs in last 24 hours: Filed Vitals:   08/18/11 1551 08/18/11 2129 08/19/11 0539 08/19/11 1428  BP: 136/91 112/69 98/58 94/59   Pulse: 88 85 77 78  Temp: 97.8 F (36.6 C) 98.4 F (36.9 C) 98.1 F (36.7 C) 98.2 F (36.8 C)  TempSrc: Oral Oral Oral Oral  Resp: 18 18 18 18   Height: 6\' 4"  (1.93 m)     Weight: 93.895 kg (207 lb)     SpO2: 97% 93% 94% 98%   Weight change: 0 kg (0 oz)  Intake/Output Summary (Last 24 hours) at 08/19/11 1736 Last data filed at 08/19/11 0600  Gross per 24 hour  Intake 1903.33 ml  Output   1375 ml  Net 528.33 ml   General: Slightly drowsy. Eating a clear liquid tray. Lungs clear to auscultation bilaterally without wheeze rhonchi or rales Cardiovascular regular rate rhythm without murmurs gallops rubs Abdomen soft left lower quadrant still tender. Slightly distended. Extremities no clubbing cyanosis or edema.  Lab Results: Basic Metabolic Panel:  Lab 08/18/11 6270  NA 135  K 4.1  CL 98  CO2 27  GLUCOSE 107*  BUN 11  CREATININE 1.21  CALCIUM 10.1  MG --  PHOS --   Liver Function Tests:  Lab 08/18/11 0714  AST 30  ALT 24  ALKPHOS 103  BILITOT 0.3  PROT 8.6*  ALBUMIN 4.2    Lab 08/18/11 0714  LIPASE 29  AMYLASE --   No results found for this basename: AMMONIA:2 in the last 168 hours CBC:  Lab 08/18/11 0714  WBC 15.8*  NEUTROABS 10.6*  HGB 13.3  HCT 40.7  MCV 86.0  PLT 704*   Cardiac Enzymes: No results found for this basename: CKTOTAL:3,CKMB:3,CKMBINDEX:3,TROPONINI:3 in the last 168 hours BNP: No results found for this basename: PROBNP:3 in the last 168 hours D-Dimer: No results found for this basename: DDIMER:2 in the last 168 hours CBG: No results found  for this basename: GLUCAP:6 in the last 168 hours Hemoglobin A1C: No results found for this basename: HGBA1C in the last 168 hours Fasting Lipid Panel: No results found for this basename: CHOL,HDL,LDLCALC,TRIG,CHOLHDL,LDLDIRECT in the last 350 hours Thyroid Function Tests: No results found for this basename: TSH,T4TOTAL,FREET4,T3FREE,THYROIDAB in the last 168 hours Coagulation: No results found for this basename: LABPROT:4,INR:4 in the last 168 hours Anemia Panel: No results found for this basename: VITAMINB12,FOLATE,FERRITIN,TIBC,IRON,RETICCTPCT in the last 168 hours Urine Drug Screen: Drugs of Abuse  No results found for this basename: labopia, cocainscrnur, labbenz, amphetmu, thcu, labbarb    Alcohol Level: No results found for this basename: ETH:2 in the last 168 hours Urinalysis:  Lab 08/18/11 0715  COLORURINE YELLOW  LABSPEC 1.015  PHURINE 6.0  GLUCOSEU NEGATIVE  HGBUR TRACE*  BILIRUBINUR NEGATIVE  KETONESUR NEGATIVE  PROTEINUR NEGATIVE  UROBILINOGEN 0.2  NITRITE NEGATIVE  LEUKOCYTESUR NEGATIVE    Micro Results: No results found for this or any previous visit (from the past 240 hour(s)). Scheduled Meds:   . cefTRIAXone (ROCEPHIN)  IV  1 g Intravenous Q24H  . enoxaparin  40 mg Subcutaneous Q24H  . feeding supplement  30 mL Oral BID  . feeding supplement  1 Container Oral BID BM  . metronidazole  500 mg Intravenous Q8H  . propranolol  80 mg Oral BID  . Tamsulosin HCl  0.4 mg Oral QPC supper  . DISCONTD: heparin  5,000 Units Subcutaneous Q8H   Continuous Infusions:   . 0.9 % NaCl with KCl 20 mEq / L 100 mL/hr at 08/19/11 0626   PRN Meds:.acetaminophen, acetaminophen, alum & mag hydroxide-simeth, HYDROmorphone (DILAUDID) injection, ondansetron (ZOFRAN) IV, ondansetron, DISCONTD:  HYDROmorphone (DILAUDID) injection Assessment/Plan: Active Problems:  Diverticulitis of large intestine with perforation  Benign hypertension  Ureterolithiasis  Hydronephrosis   History of migraine  ADD (attention deficit disorder)  Discussed with Dr. Jena Gauss. I agree that patient is unlikely to improve without surgery. Patient requests referral to a colorectal surgeon. I will look into transferring him to [facility]. I have called [facility]. A colorectal surgeon will be joining their staff this fall but they have not currently.   LOS: 1 day   Willam Munford L 08/19/2011, 5:36 PM

## 2011-08-19 NOTE — Progress Notes (Signed)
UR Chart Review Completed  

## 2011-08-19 NOTE — Progress Notes (Signed)
INITIAL ADULT NUTRITION ASSESSMENT Date: 08/19/2011   Time: 2:44 PM Reason for Assessment: Nutrition Risk (unplanned wt loss)  ASSESSMENT: Male 41 y.o.  Dx: Complicated diverticulitis   Past Medical History  Diagnosis Date  . Kidney calculi   . Diverticula of colon     complicated by abscess, since 03/2011  . Hypertension   . Migraine    Scheduled Meds:   . cefTRIAXone (ROCEPHIN)  IV  1 g Intravenous Q24H  . heparin  5,000 Units Subcutaneous Q8H  . metronidazole  500 mg Intravenous Q8H  . propranolol  80 mg Oral BID  . Tamsulosin HCl  0.4 mg Oral QPC supper  . DISCONTD: sodium chloride   Intravenous STAT  . DISCONTD: ibuprofen  800 mg Oral Once   Continuous Infusions:   . 0.9 % NaCl with KCl 20 mEq / L 100 mL/hr at 08/19/11 0626  . DISCONTD: sodium chloride 125 mL/hr at 08/18/11 1118   PRN Meds:.acetaminophen, acetaminophen, alum & mag hydroxide-simeth, HYDROmorphone (DILAUDID) injection, ondansetron (ZOFRAN) IV, ondansetron, DISCONTD:  HYDROmorphone (DILAUDID) injection, DISCONTD:  HYDROmorphone (DILAUDID) injection, DISCONTD: ondansetron (ZOFRAN) IV  Ht: 6\' 4"  (193 cm)  Wt: 207 lb (93.895 kg)  Ideal Wt:  86.8 kg % Ideal Wt: 108%  Usual Wt: 245# -January 2013 % Usual Wt: 84%  Body mass index is 25.20 kg/(m^2).Normal  Food/Nutrition Related Hx: Pt is cooperative gentleman who reports severe unintentional wt loss  38#,16% since January this yr. Reports "anorexia" and fear of eating r/t potential GI symptoms.Reports very limited po intake sometimes only one meal per day.  He is amiable to receiving oral supplements pending advancement of his diet. This nice gentleman meets criteria for Non-Severe Malnutrition in the context of chronic illness given his significant wt loss (>10% x 5 months) and inadequate energy intake (<75% for > 1 month).    CMP     Component Value Date/Time   NA 135 08/18/2011 0714   K 4.1 08/18/2011 0714   CL 98 08/18/2011 0714   CO2 27 08/18/2011  0714   GLUCOSE 107* 08/18/2011 0714   BUN 11 08/18/2011 0714   CREATININE 1.21 08/18/2011 0714   CALCIUM 10.1 08/18/2011 0714   PROT 8.6* 08/18/2011 0714   ALBUMIN 4.2 08/18/2011 0714   AST 30 08/18/2011 0714   ALT 24 08/18/2011 0714   ALKPHOS 103 08/18/2011 0714   BILITOT 0.3 08/18/2011 0714   GFRNONAA 72* 08/18/2011 0714   GFRAA 84* 08/18/2011 0714    Intake/Output Summary (Last 24 hours) at 08/19/11 1454 Last data filed at 08/19/11 0600  Gross per 24 hour  Intake 1903.33 ml  Output   1375 ml  Net 528.33 ml    Diet Order: Clear Liquid  Supplements/Tube Feeding:none at this time  IVF:    0.9 % NaCl with KCl 20 mEq / L Last Rate: 100 mL/hr at 08/19/11 1610  DISCONTD: sodium chloride Last Rate: 125 mL/hr at 08/18/11 1118    Estimated Nutritional Needs:   Kcal:2256-2538 kcal/day Protein:103-122 gr/day Fluid:1 ml/kcal  NUTRITION DIAGNOSIS: -Inadequate oral intake (NI-2.1).  Status: Ongoing  RELATED TO: altered GI function  AS EVIDENCE BY: pt hx  MONITORING/EVALUATION(Goals): -Monitor diet advancement, meal and suppl intake %, wt trends and labs  EDUCATION NEEDS: -Education needs addressed  INTERVENTION: -Add Resource Breeze BID -Add ProStat 30 ml BID  -RD will follow for nutrition needs  Dietitian [phone]  DOCUMENTATION CODES Per approved criteria  -Non-severe (moderate) malnutrition in the context of chronic illness  Francene Boyers 08/19/2011, 2:44 PM

## 2011-08-19 NOTE — Consult Note (Signed)
Referring Provider: Christiane Ha, MD Primary Care Physician:  Maximiano Coss, MD, MD Primary Gastroenterologist:  Roetta Sessions, MD  Reason for Consultation:  Complicated diverticulitis  HPI: Wesley Henson is a 41 y.o. male admitted yesterday with complicated diverticulitis.  Patient states symptoms began in 03/2011. Initially treated empirically for colitis with oral Cipro/Flagyl. He also reports getting some steroids from a college for his "colitis" as well. States he did okay for a couple of weeks after antibiotics but then had recurrent abdominal pain. Presented to [facility] and was admitted for complicated diverticulitis with abscesses. Initial CT was on 05/11/11. He had sigmoid diverticulitis with gas containing 1.2 X 3.9cm pericolonic abscess and 2.1cm right pararectal abscess. He also had mild right hydronephrosis and hydroureter with 5mm stone in distal third of ureter. He was treated with IV Cipro/IV Flagyl for at least two weeks (went home with PICC). He was thought to have hypotensive episodes secondary to SIRS while admitted. Patient was evaluated by general surgeon, Dr. Eliseo Squires. He had repeat CT on 05/19/11. Right hydronephrosis and hydroureter had resolved but persistent distal right ureteral stone 3 cm proximal to ureteral vesicle junction. Little change in sigmoid diverticulitis with some improvement in the pericolonic air collections but new fluid within left inguinal canal with demonstrates peripheral enhancement and may be infected.   Patient reports receiving Tygacil after completing two weeks if IV Cipro/Flagyl. Patient never completely improved. In 05/2011 he reports third CT was somewhat improved therefore antibiotics were held. I do not have access to that report.   Patient states he had his records reviewed by gastroenterologist in [place]. Patient's cousin is MD in [place] and arranged for appointment. States he agreed with course of treatment thus far. Patient  requested his PCP start him on Asacol 800 tid due to study. Lots of dietary changes and transition to high fiber diet.   Some days without pain but ongoing gas/bloating. Some severe rectal pain. LLQ pain/mass. Last week, right distal ureter, lithotrypsy. Bowel prep, magnesium citrate, given and caused severe pain. He developed Temp 101.4 afterwards. Miralax 17g daily. BM soft/formed daily stools. Very gassy. Long term probiotics. [facility] [place]. No melena. Last fall, brbpr with BM, h/o hemorrhids. No prior colonoscopy. Omeprazole since his late 10s. EGD age 70, for globus. 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 Patient also reports Singles within last couple of months. Lost 40 pounds since 03/2011.     Mother diverticulitis (two episodes). Maternal grandmother colon ca, died age 29.     Prior to Admission medications   Medication Sig Start Date End Date Taking? Authorizing Provider  amphetamine-dextroamphetamine (ADDERALL) 30 MG tablet Take 30 mg by mouth 2 (two) times daily.   Yes Historical Provider, MD  cyclobenzaprine (FLEXERIL) 5 MG tablet Take 5 mg by mouth 3 (three) times daily as needed. Muscle spasm   Yes Historical Provider, MD  docusate sodium (COLACE) 100 MG capsule Take 100 mg by mouth 2 (two) times daily as needed. constipation   Yes Historical Provider, MD  HYDROcodone-acetaminophen (LORTAB) 10-500 MG per tablet Take 1 tablet by mouth every 6 (six) hours as needed. pain   Yes Historical Provider, MD  losartan (COZAAR) 50 MG tablet Take 50 mg by mouth daily.   Yes Historical Provider, MD  mesalamine (ASACOL) 400 MG EC tablet Take 800 mg by mouth TID.   Yes Historical Provider, MD  omeprazole (PRILOSEC) 20 MG capsule Take 20 mg by mouth daily.   Yes Historical Provider, MD  oxyCODONE-acetaminophen (PERCOCET) 10-325 MG  per tablet Take 1 tablet by mouth every 6 (six) hours as needed. pain   Yes Historical Provider, MD  Probiotic Product ([facility]ON HEALTH) CAPS Take 1 capsule by mouth  daily.   Yes Historical Provider, MD  propranolol (INDERAL) 80 MG tablet Take 80 mg by mouth 2 (two) times daily.   Yes Historical Provider, MD  Tamsulosin HCl (FLOMAX) 0.4 MG CAPS Take 0.4 mg by mouth daily.    Yes Historical Provider, MD    Current Facility-Administered Medications  Medication Dose Route Frequency Provider Last Rate Last Dose  . 0.9 % NaCl with KCl 20 mEq/ L  infusion   Intravenous Continuous Christiane Ha, MD 100 mL/hr at 08/19/11 1610    . acetaminophen (TYLENOL) tablet 650 mg  650 mg Oral Q6H PRN Christiane Ha, MD       Or  . acetaminophen (TYLENOL) suppository 650 mg  650 mg Rectal Q6H PRN Christiane Ha, MD      . alum & mag hydroxide-simeth (MAALOX/MYLANTA) 200-200-20 MG/5ML suspension 30 mL  30 mL Oral Q6H PRN Christiane Ha, MD      . cefTRIAXone (ROCEPHIN) 1 g in dextrose 5 % 50 mL IVPB  1 g Intravenous Q24H Christiane Ha, MD   1 g at 08/18/11 1752  . heparin injection 5,000 Units  5,000 Units Subcutaneous Q8H Christiane Ha, MD   5,000 Units at 08/19/11 0558  . HYDROmorphone (DILAUDID) injection 2 mg  2 mg Intravenous Q3H PRN Christiane Ha, MD   2 mg at 08/19/11 0834  . metroNIDAZOLE (FLAGYL) IVPB 500 mg  500 mg Intravenous Q8H Christiane Ha, MD   500 mg at 08/19/11 1156  . ondansetron (ZOFRAN) tablet 4 mg  4 mg Oral Q6H PRN Christiane Ha, MD       Or  . ondansetron [facility]) injection 4 mg  4 mg Intravenous Q6H PRN Christiane Ha, MD      . propranolol (INDERAL) tablet 80 mg  80 mg Oral BID Christiane Ha, MD   80 mg at 08/18/11 2103  . Tamsulosin HCl (FLOMAX) capsule 0.4 mg  0.4 mg Oral QPC supper Christiane Ha, MD   0.4 mg at 08/18/11 1749  . DISCONTD: 0.9 %  sodium chloride infusion   Intravenous Continuous Donnetta Hutching, MD 125 mL/hr at 08/18/11 1118    . DISCONTD: 0.9 %  sodium chloride infusion   Intravenous STAT Donnetta Hutching, MD      . DISCONTD: HYDROmorphone (DILAUDID) injection 1 mg  1 mg Intravenous Q4H  PRN Donnetta Hutching, MD   1 mg at 08/18/11 1400  . DISCONTD: HYDROmorphone (DILAUDID) injection 1 mg  1 mg Intravenous Q3H PRN Christiane Ha, MD   1 mg at 08/19/11 0558  . DISCONTD: ibuprofen (ADVIL,MOTRIN) tablet 800 mg  800 mg Oral Once Avon Gully, MD      . DISCONTD: ondansetron (ZOFRAN) injection 4 mg  4 mg Intravenous Q8H PRN Donnetta Hutching, MD   4 mg at 08/18/11 1400    Allergies as of 08/18/2011 - Review Complete 08/18/2011  Allergen Reaction Noted  . Penicillins Anaphylaxis 08/03/2011    Past Medical History  Diagnosis Date  . Kidney calculi   . Diverticula of colon     complicated by abscess, since 03/2011  . Hypertension   . Migraine     Past Surgical History  Procedure Date  . Wisdom tooth extraction   . Lithotripsy  Family History  Problem Relation Age of Onset  . Colon cancer Maternal Grandmother   . Diverticulitis Mother     History   Social History  . Marital Status: Married    Spouse Name: N/A    Number of Children: N/A  . Years of Education: N/A   Occupational History  . LPN    Social History Main Topics  . Smoking status: Never Smoker   . Smokeless tobacco: Not on file  . Alcohol Use: No  . Drug Use: No  . Sexually Active: Yes -- Male partner(s)   Other Topics Concern  . Not on file   Social History Narrative  . No narrative on file     ROS:  General: Negative for chills, fatigue, weakness. See HPI. Eyes: Negative for vision changes.  ENT: Negative for hoarseness, difficulty swallowing , nasal congestion. CV: Negative for chest pain, angina, palpitations, dyspnea on exertion, peripheral edema.  Respiratory: Negative for dyspnea at rest, dyspnea on exertion, cough, sputum, wheezing.  GI: See history of present illness. GU:  Negative for dysuria, hematuria, urinary frequency, nocturnal urination. C/O urinary incontinence and urgency. MS: Negative for joint pain, low back pain.  Derm: Negative for rash or itching. Recent Shingles  resolved. Neuro: Negative for weakness, abnormal sensation, seizure, frequent headaches, memory loss, confusion.  Psych: Negative for anxiety, depression, suicidal ideation, hallucinations. C/o stress. Endo: see hpi  Heme: Negative for bruising or bleeding. Allergy: Negative for rash or hives.       Physical Examination: Vital signs in last 24 hours: Temp:  [97.8 F (36.6 C)-98.4 F (36.9 C)] 98.1 F (36.7 C) (05/24 0539) Pulse Rate:  [77-88] 77  (05/24 0539) Resp:  [18] 18  (05/24 0539) BP: (98-136)/(58-91) 98/58 mmHg (05/24 0539) SpO2:  [93 %-97 %] 94 % (05/24 0539) Weight:  [207 lb (93.895 kg)] 207 lb (93.895 kg) (05/23 1551) Last BM Date: 08/17/11  General: Well-nourished, well-developed in no acute distress. Son present. Head: Normocephalic, atraumatic.   Eyes: Conjunctiva pink, no icterus. Mouth: Oropharyngeal mucosa moist and pink , no lesions erythema or exudate. Neck: Supple without thyromegaly, masses, or lymphadenopathy.  Lungs: Clear to auscultation bilaterally.  Heart: Regular rate and rhythm, no murmurs rubs or gallops.  Abdomen: Bowel sounds are normal, nondistended, no hepatosplenomegaly or masses, no abdominal bruits. Tenderness in LLQ. Palpable mass in left inguinal area very tender and subjective guarding.    Rectal: not performed Extremities: No lower extremity edema, clubbing, deformity.  Neuro: Alert and oriented x 4 , grossly normal neurologically.  Skin: Warm and dry, no rash or jaundice.  Multiple tatoos. Psych: Alert and cooperative, normal mood and affect.        Intake/Output from previous day: 05/23 0701 - 05/24 0700 In: 1903.3 [P.O.:440; I.V.:1213.3; IV Piggyback:250] Out: 1375 [Urine:1375] Intake/Output this shift:    Lab Results: CBC  Basename 08/18/11 0714  WBC 15.8*  HGB 13.3  HCT 40.7  MCV 86.0  PLT 704*   BMET  Basename 08/18/11 0714  NA 135  K 4.1  CL 98  CO2 27  GLUCOSE 107*  BUN 11  CREATININE 1.21  CALCIUM 10.1    LFT  Basename 08/18/11 0714  BILITOT 0.3  BILIDIR <0.1  IBILI NOT CALCULATED  ALKPHOS 103  AST 30  ALT 24  PROT 8.6*  ALBUMIN 4.2     Imaging Studies: Ct Abdomen Pelvis W Contrast  08/18/2011  *RADIOLOGY REPORT*  Clinical Data: Left side abdominal pain, history diverticulitis and kidney  stones  CT ABDOMEN AND PELVIS WITH CONTRAST  Technique:  Multidetector CT imaging of the abdomen and pelvis was performed following the standard protocol during bolus administration of intravenous contrast. Sagittal and coronal MPR images reconstructed from axial data set.  Contrast: OMNIPAQUE IOHEXOL 300 MG/ML  SOLN. Dilute oral contrast.  Comparison: None Correlation:  Abdominal radiographs 08/18/2011  Findings: Minimal subpleural atelectasis at lung bases. Mild right hydronephrosis and hydroureter with slight delay in right nephrogram versus left. Two right ureteral calculi are identified: 3 mm diameter distal right ureter image 71 and at right ureterovesicle junction 2 mm diameter image 76. No additional urinary tract calcifications or left side hydronephrosis/hydroureter. Liver, spleen, pancreas, kidneys, and adrenal glands otherwise normal appearance.  Significant wall thickening of the sigmoid colon with pericolic inflammatory changes of the sigmoid mesocolon compatible with acute diverticulitis. Two small extraluminal gas and fluid collections are seen in the pelvis, potentially communicating, compatible with diverticular abscesses, larger measuring 3.8 x 2.1 x 2.5 cm image 75, smaller measuring 1.4 x 1.4 x 1.7 cm image 71. Extensive pericolic inflammation extends into the left internal inguinal ring with a left inguinal hernia containing fat. Low attenuation is seen within the left inguinal canal, 4 cm greatest diameter, question free fluid, hydrocele, cannot exclude infection.  Dilated right internal inguinal ring without inflammation or bowel herniation. Questionable wall thickening of the rectum  versus additional inflammation or collapsed state. Stomach and remaining bowel loops normal appearance. No definite free intraperitoneal air identified. No mass, adenopathy, or acute osseous findings.  IMPRESSION: Right hydronephrosis and hydroureter secondary to 2 distal right ureteral calculi as above. Acute sigmoid diverticulitis with extensive pericolic inflammatory changes and evidence of two small versus single communicating diverticular abscess collections. Extension of inflammatory changes and fluid along a visualized fat containing left inguinal hernia into the left inguinal canal.  Findings called to Dr. Adriana Simas on 08/18/2011 at 1130 hours.  Original Report Authenticated By: Lollie Marrow, M.D.   Dg Abd Acute W/chest  08/18/2011  *RADIOLOGY REPORT*  Clinical Data: Left side abdominal pain, history kidney stones with lithotripsy, history diverticulitis  ACUTE ABDOMEN SERIES (ABDOMEN 2 VIEW & CHEST 1 VIEW)  Comparison: None  Findings: Normal heart size, mediastinal contours, and pulmonary vascularity. Lungs appear slightly emphysematous without infiltrate or effusion. Questionable nodular density versus nipple shadow 11 x 9 mm right lung base. No pneumothorax. Gas and stool throughout colon. Air filled loops of small bowel in the left mid abdomen, slightly prominent size up to 4.5 cm diameter, nonspecific. No definite bowel wall thickening or free intraperitoneal air. Scattered pelvic phleboliths. No definite urinary tract calcification. No acute osseous findings.  IMPRESSION: Question right nipple shadow versus right lung base nodule; recommend repeat PA chest radiograph with nipple markers to exclude pulmonary nodule. Nonspecific air filled mildly distended loops of small bowel wall in left mid abdomen, though gas and stool are present throughout colon. Findings could represent ileus though early or partial small bowel obstruction not completely excluded. No other acute intra abdominal findings identified.   Original Report Authenticated By: Lollie Marrow, M.D.  Pierre.Alas week]   Impression: 41 y/o gentleman who has had smoldering complicated diverticulitis dating back to 03/2011 when symptoms first began. Four CT scans total, with last one yesterday, showing no overall improvement in abscesses. He has had multiple rounds of both oral and IV antibiotics including Cipro/Flagyl/Tygacil. Since 05/17/11 CT, new finding involving left inguinal area, which is significantly tender on exam with palpable mass. Reportedly,  these abscesses are not amenable to percutaneous drainage.  Plan: 1. Discussed with Dr. Jena Gauss. Patient has failed medical management for past 5 months. Change in antibiotics unlikely to provide him adequate treatment at this point. Patient needs surgical intervention. Dr. Jena Gauss to review films with radiologist with close attention to left inguinal findings as well. Further recommendations to follow. Discussed with Dr. Lendell Caprice.   I would like to thank Dr. Lendell Caprice for allowing [place] to take part in the care of this nice patient.    LOS: 1 day   Tana Coast  08/19/2011, 11:59 AM    Patient seen and examined.  Serial CT scans reviewed. This gentleman has smoldering, complicated sigmoid diverticulitis with perforation, abscess and significant phlegmon formation extending into a left inguinal hernia which has failed medical management. This process is not amenable to percutaneous drainage. I recommend surgical consultation for surgical drainage. He may well need a diverting colostomy as well.  Discussed with Dr. Lendell Caprice. Thanks for consult

## 2011-08-20 DIAGNOSIS — K5732 Diverticulitis of large intestine without perforation or abscess without bleeding: Secondary | ICD-10-CM

## 2011-08-20 DIAGNOSIS — K651 Peritoneal abscess: Secondary | ICD-10-CM

## 2011-08-20 DIAGNOSIS — F988 Other specified behavioral and emotional disorders with onset usually occurring in childhood and adolescence: Secondary | ICD-10-CM

## 2011-08-20 DIAGNOSIS — I10 Essential (primary) hypertension: Secondary | ICD-10-CM

## 2011-08-20 LAB — DIFFERENTIAL
Basophils Relative: 0 % (ref 0–1)
Eosinophils Absolute: 0.3 10*3/uL (ref 0.0–0.7)
Monocytes Absolute: 1.5 10*3/uL — ABNORMAL HIGH (ref 0.1–1.0)
Monocytes Relative: 14 % — ABNORMAL HIGH (ref 3–12)

## 2011-08-20 LAB — CBC
HCT: 33.7 % — ABNORMAL LOW (ref 39.0–52.0)
Hemoglobin: 11.2 g/dL — ABNORMAL LOW (ref 13.0–17.0)
MCH: 28.4 pg (ref 26.0–34.0)
MCHC: 33.2 g/dL (ref 30.0–36.0)
MCV: 85.5 fL (ref 78.0–100.0)

## 2011-08-20 MED ORDER — NALOXONE HCL 0.4 MG/ML IJ SOLN: 0.4000 mg | INTRAMUSCULAR | Status: DC | PRN

## 2011-08-20 MED ORDER — DIPHENHYDRAMINE HCL 12.5 MG/5ML PO ELIX: 12.5000 mg | ORAL_SOLUTION | Freq: Four times a day (QID) | ORAL | Status: DC | PRN

## 2011-08-20 MED ORDER — DIPHENHYDRAMINE HCL 50 MG/ML IJ SOLN: 12.5000 mg | Freq: Four times a day (QID) | INTRAMUSCULAR | Status: DC | PRN

## 2011-08-20 MED ORDER — HYDROMORPHONE 0.3 MG/ML IV SOLN
INTRAVENOUS | Status: DC
  Administered 2011-08-20: 0.3 mg via INTRAVENOUS
  Administered 2011-08-20: 16:00:00 via INTRAVENOUS
  Administered 2011-08-21: 1.5 mg via INTRAVENOUS
  Administered 2011-08-21: 0.6 mg via INTRAVENOUS
  Administered 2011-08-21: 2.4 mg via INTRAVENOUS
  Administered 2011-08-21: via INTRAVENOUS
  Administered 2011-08-22: 3.85 mg via INTRAVENOUS
  Administered 2011-08-22: 06:00:00 via INTRAVENOUS
  Administered 2011-08-22: 13 mg via INTRAVENOUS
  Administered 2011-08-22 – 2011-08-23 (×2): via INTRAVENOUS
  Administered 2011-08-23: 8 mg via INTRAVENOUS
  Administered 2011-08-23: 4 mg via INTRAVENOUS
  Filled 2011-08-20 (×6): qty 25

## 2011-08-20 MED ORDER — PANTOPRAZOLE SODIUM 40 MG PO TBEC
40.0000 mg | DELAYED_RELEASE_TABLET | Freq: Every day | ORAL | Status: DC
  Administered 2011-08-20 – 2011-08-22 (×3): 40 mg via ORAL
  Filled 2011-08-20 (×3): qty 1

## 2011-08-20 MED ORDER — SODIUM CHLORIDE 0.9 % IJ SOLN
9.0000 mL | INTRAMUSCULAR | Status: DC | PRN
  Administered 2011-08-20: 9 mL via INTRAVENOUS
  Filled 2011-08-20 (×2): qty 3

## 2011-08-20 NOTE — Progress Notes (Signed)
Subjective: Pain still quite severe between Dilaudid doses. No right flank pain. Has not passed any stones in his urine. Tolerating a clear diet.  Objective: Vital signs in last 24 hours: Filed Vitals:   08/19/11 0539 08/19/11 1428 08/19/11 2101 08/20/11 0553  BP: 98/58 94/59 99/61  119/68  Pulse: 77 78 98 105  Temp: 98.1 F (36.7 C) 98.2 F (36.8 C) 100.8 F (38.2 C) 98.9 F (37.2 C)  TempSrc: Oral Oral Oral Oral  Resp: 18 18 20 20   Height:      Weight:      SpO2: 94% 98% 96% 100%   Weight change:   Intake/Output Summary (Last 24 hours) at 08/20/11 1303 Last data filed at 08/20/11 1126  Gross per 24 hour  Intake   1734 ml  Output   2675 ml  Net   -941 ml   General: Asleep. Arousable. Lungs clear to auscultation bilaterally without wheeze rhonchi or rales Cardiovascular regular rate rhythm without murmurs gallops rubs Abdomen soft left lower quadrant still tender. Slightly distended. Extremities no clubbing cyanosis or edema.  Lab Results: Basic Metabolic Panel:  Lab 08/18/11 1610  NA 135  K 4.1  CL 98  CO2 27  GLUCOSE 107*  BUN 11  CREATININE 1.21  CALCIUM 10.1  MG --  PHOS --   Liver Function Tests:  Lab 08/18/11 0714  AST 30  ALT 24  ALKPHOS 103  BILITOT 0.3  PROT 8.6*  ALBUMIN 4.2    Lab 08/18/11 0714  LIPASE 29  AMYLASE --   No results found for this basename: AMMONIA:2 in the last 168 hours CBC:  Lab 08/18/11 0714  WBC 15.8*  NEUTROABS 10.6*  HGB 13.3  HCT 40.7  MCV 86.0  PLT 704*   Cardiac Enzymes: No results found for this basename: CKTOTAL:3,CKMB:3,CKMBINDEX:3,TROPONINI:3 in the last 168 hours BNP: No results found for this basename: PROBNP:3 in the last 168 hours D-Dimer: No results found for this basename: DDIMER:2 in the last 168 hours CBG: No results found for this basename: GLUCAP:6 in the last 168 hours Hemoglobin A1C: No results found for this basename: HGBA1C in the last 168 hours Fasting Lipid Panel: No results  found for this basename: CHOL,HDL,LDLCALC,TRIG,CHOLHDL,LDLDIRECT in the last 960 hours Thyroid Function Tests: No results found for this basename: TSH,T4TOTAL,FREET4,T3FREE,THYROIDAB in the last 168 hours Coagulation: No results found for this basename: LABPROT:4,INR:4 in the last 168 hours Anemia Panel: No results found for this basename: VITAMINB12,FOLATE,FERRITIN,TIBC,IRON,RETICCTPCT in the last 168 hours Urine Drug Screen: Drugs of Abuse  No results found for this basename: labopia,  cocainscrnur,  labbenz,  amphetmu,  thcu,  labbarb    Alcohol Level: No results found for this basename: ETH:2 in the last 168 hours Urinalysis:  Lab 08/18/11 0715  COLORURINE YELLOW  LABSPEC 1.015  PHURINE 6.0  GLUCOSEU NEGATIVE  HGBUR TRACE*  BILIRUBINUR NEGATIVE  KETONESUR NEGATIVE  PROTEINUR NEGATIVE  UROBILINOGEN 0.2  NITRITE NEGATIVE  LEUKOCYTESUR NEGATIVE    Micro Results: No results found for this or any previous visit (from the past 240 hour(s)). Scheduled Meds:    . cefTRIAXone (ROCEPHIN)  IV  1 g Intravenous Q24H  . enoxaparin  40 mg Subcutaneous Q24H  . feeding supplement  30 mL Oral BID  . feeding supplement  1 Container Oral BID BM  . HYDROmorphone PCA 0.3 mg/mL   Intravenous Q4H  . metronidazole  500 mg Intravenous Q8H  . propranolol  80 mg Oral BID  . Tamsulosin HCl  0.4  mg Oral QPC supper  . DISCONTD: heparin  5,000 Units Subcutaneous Q8H   Continuous Infusions:    . 0.9 % NaCl with KCl 20 mEq / L 100 mL/hr at 08/20/11 0556   PRN Meds:.acetaminophen, acetaminophen, alum & mag hydroxide-simeth, diphenhydrAMINE, diphenhydrAMINE, naloxone, ondansetron (ZOFRAN) IV, ondansetron, sodium chloride, DISCONTD:  HYDROmorphone (DILAUDID) injection Assessment/Plan: Active Problems:  Diverticulitis of large intestine with perforation  Benign hypertension  Ureterolithiasis  Hydronephrosis  History of migraine  ADD (attention deficit disorder)  Discussed the case with Dr.  Alona Bene, on call for general surgery at [facility]. He has agreed to accept the patient on Tuesday. No colorectal surgeons are available this weekend but will be back on Tuesday. [employer] will arrange transportation.  Patient still has significant pain. Will change to intermittent Dilaudid 2 PCA. Patient is agreeable. Patient also would benefit from a PICC line and TPN. He's lost 40 pounds since January and will be on clear liquids for now, then will be n.p.o. after surgery. However, patient refuses PICC line and TPN at this time and would like to wait until transfer to [facility]. This is reasonable as there may not be picked nurse availability this weekend here. Check labs in the morning.   LOS: 2 days   Kalsey Lull L 08/20/2011, 1:03 PM

## 2011-08-21 DIAGNOSIS — K651 Peritoneal abscess: Secondary | ICD-10-CM

## 2011-08-21 DIAGNOSIS — K5732 Diverticulitis of large intestine without perforation or abscess without bleeding: Secondary | ICD-10-CM

## 2011-08-21 DIAGNOSIS — I10 Essential (primary) hypertension: Secondary | ICD-10-CM

## 2011-08-21 DIAGNOSIS — F988 Other specified behavioral and emotional disorders with onset usually occurring in childhood and adolescence: Secondary | ICD-10-CM

## 2011-08-21 LAB — BASIC METABOLIC PANEL
BUN: 5 mg/dL — ABNORMAL LOW (ref 6–23)
Creatinine, Ser: 0.9 mg/dL (ref 0.50–1.35)
GFR calc Af Amer: >90 mL/min (ref >90)
GFR calc non Af Amer: >90 mL/min (ref >90)

## 2011-08-21 MED ORDER — SODIUM CHLORIDE 0.9 % IJ SOLN
INTRAMUSCULAR | Status: AC
  Administered 2011-08-21: 01:00:00
  Filled 2011-08-21: qty 3

## 2011-08-21 MED ORDER — PROPRANOLOL HCL 20 MG PO TABS
80.0000 mg | ORAL_TABLET | Freq: Two times a day (BID) | ORAL | Status: DC
  Administered 2011-08-22 (×2): 80 mg via ORAL
  Filled 2011-08-21 (×2): qty 4

## 2011-08-21 NOTE — Progress Notes (Signed)
Dr. Lendell Caprice returned page regarding pt's low BP at 1849. New orders received to place parameters on Propranolol dosage. Order states to hold Propranolol if SBP less that 90. Will continue to monitor.

## 2011-08-21 NOTE — Progress Notes (Signed)
Subjective: Pain better controlled on PCA. Reports that the clear liquids are making him nauseated. Requesting crackers. Still having bowel movements.  Objective: Vital signs in last 24 hours: Filed Vitals:   08/21/11 0828 08/21/11 1012 08/21/11 1231 08/21/11 1418  BP:  102/65  98/62  Pulse:  72  66  Temp: 99.8 F (37.7 C) 97.5 F (36.4 C)  97.9 F (36.6 C)  TempSrc: Oral     Resp:  18 15 15   Height:      Weight:      SpO2:  96% 97% 98%   Weight change:   Intake/Output Summary (Last 24 hours) at 08/21/11 1530 Last data filed at 08/21/11 1300  Gross per 24 hour  Intake 3242.67 ml  Output   1450 ml  Net 1792.67 ml   General: Asleep. Arousable. Lungs clear to auscultation bilaterally without wheeze rhonchi or rales Cardiovascular regular rate rhythm without murmurs gallops rubs Abdomen soft left lower quadrant less tender. Less distended Extremities no clubbing cyanosis or edema.  Lab Results: Basic Metabolic Panel:  Lab 08/21/11 1610 08/18/11 0714  NA 130* 135  K 4.0 4.1  CL 97 98  CO2 25 27  GLUCOSE 116* 107*  BUN 5* 11  CREATININE 0.90 1.21  CALCIUM 9.0 10.1  MG -- --  PHOS -- --   Liver Function Tests:  Lab 08/18/11 0714  AST 30  ALT 24  ALKPHOS 103  BILITOT 0.3  PROT 8.6*  ALBUMIN 4.2    Lab 08/18/11 0714  LIPASE 29  AMYLASE --   No results found for this basename: AMMONIA:2 in the last 168 hours CBC:  Lab 08/20/11 1335 08/18/11 0714  WBC 11.0* 15.8*  NEUTROABS 6.6 10.6*  HGB 11.2* 13.3  HCT 33.7* 40.7  MCV 85.5 86.0  PLT 499* 704*   Cardiac Enzymes: No results found for this basename: CKTOTAL:3,CKMB:3,CKMBINDEX:3,TROPONINI:3 in the last 168 hours BNP: No results found for this basename: PROBNP:3 in the last 168 hours D-Dimer: No results found for this basename: DDIMER:2 in the last 168 hours CBG: No results found for this basename: GLUCAP:6 in the last 168 hours Hemoglobin A1C: No results found for this basename: HGBA1C in the  last 168 hours Fasting Lipid Panel: No results found for this basename: CHOL,HDL,LDLCALC,TRIG,CHOLHDL,LDLDIRECT in the last 960 hours Thyroid Function Tests: No results found for this basename: TSH,T4TOTAL,FREET4,T3FREE,THYROIDAB in the last 168 hours Coagulation: No results found for this basename: LABPROT:4,INR:4 in the last 168 hours Anemia Panel: No results found for this basename: VITAMINB12,FOLATE,FERRITIN,TIBC,IRON,RETICCTPCT in the last 168 hours Urine Drug Screen: Drugs of Abuse  No results found for this basename: labopia,  cocainscrnur,  labbenz,  amphetmu,  thcu,  labbarb    Alcohol Level: No results found for this basename: ETH:2 in the last 168 hours Urinalysis:  Lab 08/18/11 0715  COLORURINE YELLOW  LABSPEC 1.015  PHURINE 6.0  GLUCOSEU NEGATIVE  HGBUR TRACE*  BILIRUBINUR NEGATIVE  KETONESUR NEGATIVE  PROTEINUR NEGATIVE  UROBILINOGEN 0.2  NITRITE NEGATIVE  LEUKOCYTESUR NEGATIVE    Micro Results: No results found for this or any previous visit (from the past 240 hour(s)). Scheduled Meds:    . cefTRIAXone (ROCEPHIN)  IV  1 g Intravenous Q24H  . enoxaparin  40 mg Subcutaneous Q24H  . feeding supplement  30 mL Oral BID  . feeding supplement  1 Container Oral BID BM  . HYDROmorphone PCA 0.3 mg/mL   Intravenous Q4H  . metronidazole  500 mg Intravenous Q8H  . pantoprazole  40 mg Oral Q1200  . propranolol  80 mg Oral BID  . sodium chloride      . Tamsulosin HCl  0.4 mg Oral QPC supper   Continuous Infusions:    . 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 Also with phlegmon and extension into the left inguinal hernia, failed medical  management. Requires surgery. Patient has requested evaluation by colorectal specialist.  Discussed the case with Dr. Alona Bene, on call for general surgery at [facility]. He has agreed to accept the patient on Tuesday. No colorectal surgeons are available this weekend but will be back on Tuesday. [facility] [employer] will arrange transportation. Continue IV fluids, Dilaudid PCA. Patient refuses PICC line and TPN at this time. Request advancement of diet. Will give full liquids, but warned patient this may increase his pain. Continue Rocephin and Flagyl. Penicillin allergic (anaphylaxis). Patient has  DVDs of several of his previous CAT scans. Will ask radiology to provide a DVD of the CAT scan done here to take with him to [facility].   Benign hypertension controlled   Ureterolithiasis, status post lithotripsy almost 2 weeks ago. Patient was scheduled to followup with his urologist in [place] next week. Surgeons at [facility] may want to consider coordinating care with urology there.   Hydronephrosis, mild with mild hydroureter   History of migraine, maintained on propranolol   ADD (attention deficit disorder), not currently on medications  Gastroesophageal reflux disease, continue protonix.   LOS: 3 days   Kym Scannell L 08/21/2011, 3:30 PM

## 2011-08-21 NOTE — Progress Notes (Signed)
Pt's BP 88/53 at this time. Pt asymptomatic. Dr. Lendell Caprice paged at [facility] per VS parameter orders. No return page at this time. Will continue to monitor.

## 2011-08-22 DIAGNOSIS — I10 Essential (primary) hypertension: Secondary | ICD-10-CM

## 2011-08-22 DIAGNOSIS — K5732 Diverticulitis of large intestine without perforation or abscess without bleeding: Secondary | ICD-10-CM

## 2011-08-22 DIAGNOSIS — E871 Hypo-osmolality and hyponatremia: Secondary | ICD-10-CM

## 2011-08-22 DIAGNOSIS — K651 Peritoneal abscess: Secondary | ICD-10-CM

## 2011-08-22 NOTE — Progress Notes (Signed)
Subjective: The patient says that his nausea is less. He has had no increase in abdominal pain on a full liquid diet. He had one loose bowel movement last night.  Objective: Vital signs in last 24 hours: Filed Vitals:   08/22/11 0159 08/22/11 0415 08/22/11 0519 08/22/11 1052  BP: 98/59  98/62 97/62  Pulse: 68  70 66  Temp: 98.3 F (36.8 C)  98.2 F (36.8 C) 97.9 F (36.6 C)  TempSrc: Oral  Oral Oral  Resp: 20 20 20 20   Height:      Weight:      SpO2: 94% 97% 95% 93%    Intake/Output Summary (Last 24 hours) at 08/22/11 1235 Last data filed at 08/22/11 6295  Gross per 24 hour  Intake 2952.83 ml  Output   1280 ml  Net 1672.83 ml    Weight change:   Physical exam: Lungs: Clear to auscultation bilaterally. Heart: S1, S2, with no murmurs rubs or gallops. Abdomen: Positive bowel sounds, soft, moderate tenderness left lower quadrant, no rigidity, no rebound. Extremities: No pedal edema.  Lab Results: Basic Metabolic Panel:  Basename 08/21/11 0750  NA 130*  K 4.0  CL 97  CO2 25  GLUCOSE 116*  BUN 5*  CREATININE 0.90  CALCIUM 9.0  MG --  PHOS --   Liver Function Tests: No results found for this basename: AST:2,ALT:2,ALKPHOS:2,BILITOT:2,PROT:2,ALBUMIN:2 in the last 72 hours No results found for this basename: LIPASE:2,AMYLASE:2 in the last 72 hours No results found for this basename: AMMONIA:2 in the last 72 hours CBC:  Basename 08/20/11 1335  WBC 11.0*  NEUTROABS 6.6  HGB 11.2*  HCT 33.7*  MCV 85.5  PLT 499*   Cardiac Enzymes: No results found for this basename: CKTOTAL:3,CKMB:3,CKMBINDEX:3,TROPONINI:3 in the last 72 hours BNP: No results found for this basename: PROBNP:3 in the last 72 hours D-Dimer: No results found for this basename: DDIMER:2 in the last 72 hours CBG: No results found for this basename: GLUCAP:6 in the last 72 hours Hemoglobin A1C: No results found for this basename: HGBA1C in the last 72 hours Fasting Lipid Panel: No results found  for this basename: CHOL,HDL,LDLCALC,TRIG,CHOLHDL,LDLDIRECT in the last 72 hours Thyroid Function Tests: No results found for this basename: TSH,T4TOTAL,FREET4,T3FREE,THYROIDAB in the last 72 hours Anemia Panel: No results found for this basename: VITAMINB12,FOLATE,FERRITIN,TIBC,IRON,RETICCTPCT in the last 72 hours Coagulation: No results found for this basename: LABPROT:2,INR:2 in the last 72 hours Urine Drug Screen: Drugs of Abuse  No results found for this basename: labopia, cocainscrnur, labbenz, amphetmu, thcu, labbarb    Alcohol Level: No results found for this basename: ETH:2 in the last 72 hours Urinalysis: No results found for this basename: COLORURINE:2,APPERANCEUR:2,LABSPEC:2,PHURINE:2,GLUCOSEU:2,HGBUR:2,BILIRUBINUR:2,KETONESUR:2,PROTEINUR:2,UROBILINOGEN:2,NITRITE:2,LEUKOCYTESUR:2 in the last 72 hours Misc. Labs:   Micro: No results found for this or any previous visit (from the past 240 hour(s)).  Studies/Results: No results found.  Medications:  Scheduled:   . cefTRIAXone (ROCEPHIN)  IV  1 g Intravenous Q24H  . enoxaparin  40 mg Subcutaneous Q24H  . feeding supplement  30 mL Oral BID  . feeding supplement  1 Container Oral BID BM  . HYDROmorphone PCA 0.3 mg/mL   Intravenous Q4H  . metronidazole  500 mg Intravenous Q8H  . pantoprazole  40 mg Oral Q1200  . propranolol  80 mg Oral BID  . Tamsulosin HCl  0.4 mg Oral QPC supper  . DISCONTD: propranolol  80 mg Oral BID   Continuous:   . 0.9 % NaCl with KCl 20 mEq / L 50  mL/hr at 08/21/11 2143   OZH:[id number], acetaminophen, alum & mag hydroxide-simeth, diphenhydrAMINE, diphenhydrAMINE, naloxone, ondansetron (ZOFRAN) IV, ondansetron, sodium chloride  Assessment: Active Problems:  Diverticulitis of large intestine with perforation  Benign hypertension  History of migraine  ADD (attention deficit disorder)  Ureterolithiasis  Hydronephrosis   1. Small during diverticulitis of the large colon with  perforation and abscess since January of this year. Also with phlegmon and extension into the left inguinal hernia, failed medical management. Dr. Pincus Sanes note and assessment from yesterday acknowledged. His white blood cell count is improving. He is afebrile. He is tolerating a full liquid diet. Will continue Rocephin and Flagyl. Arrangements for the patient's transfer to [facility] are pending.  Ureterolithiasis/hydronephrosis. Status post lithotripsy almost 2 weeks ago. The patient was scheduled to followup with his urologist in [place] soon. The surgeons at [facility] may want to coordinate care with urology care.  Hyponatremia. Etiology is unknown. His serum sodium was normal a few days ago. He was started on IV fluids on admission. He is now on 50 cc an hour of normal saline. Of note, he is getting hypotonic fluids with antibiotics, and therefore, they may be playing a part in his hyponatremia.  Plan:  1.Continue current management. 2. Will order laboratory studies in the morning. 3. Transfer to [facility] pending for tomorrow.   LOS: 4 days   Jaionna Weisse 08/22/2011, 12:35 PM

## 2011-08-22 NOTE — Progress Notes (Signed)
Nausea meds times two this am

## 2011-08-23 ENCOUNTER — Encounter (HOSPITAL_COMMUNITY): Payer: Self-pay | Admitting: Internal Medicine

## 2011-08-23 DIAGNOSIS — D75839 Thrombocytosis, unspecified: Secondary | ICD-10-CM

## 2011-08-23 DIAGNOSIS — D473 Essential (hemorrhagic) thrombocythemia: Secondary | ICD-10-CM | POA: Diagnosis present

## 2011-08-23 DIAGNOSIS — I10 Essential (primary) hypertension: Secondary | ICD-10-CM

## 2011-08-23 DIAGNOSIS — K651 Peritoneal abscess: Secondary | ICD-10-CM

## 2011-08-23 DIAGNOSIS — K5732 Diverticulitis of large intestine without perforation or abscess without bleeding: Secondary | ICD-10-CM

## 2011-08-23 DIAGNOSIS — E871 Hypo-osmolality and hyponatremia: Secondary | ICD-10-CM

## 2011-08-23 HISTORY — DX: Thrombocytosis, unspecified: D75.839

## 2011-08-23 LAB — CBC
HCT: 34.9 % — ABNORMAL LOW (ref 39.0–52.0)
Hemoglobin: 11.4 g/dL — ABNORMAL LOW (ref 13.0–17.0)
RBC: 4.09 MIL/uL — ABNORMAL LOW (ref 4.22–5.81)

## 2011-08-23 LAB — COMPREHENSIVE METABOLIC PANEL
CO2: 29 mEq/L (ref 19–32)
Calcium: 9.6 mg/dL (ref 8.4–10.5)
Creatinine, Ser: 0.97 mg/dL (ref 0.50–1.35)
GFR calc Af Amer: >90 mL/min (ref >90)
GFR calc non Af Amer: >90 mL/min (ref >90)
Glucose, Bld: 104 mg/dL — ABNORMAL HIGH (ref 70–99)
Sodium: 135 mEq/L (ref 135–145)
Total Protein: 6.7 g/dL (ref 6.0–8.3)

## 2011-08-23 LAB — OCCULT BLOOD X 1 CARD TO LAB, STOOL: Fecal Occult Bld: NEGATIVE

## 2011-08-23 MED ORDER — ONDANSETRON HCL 4 MG/2ML IJ SOLN: 4.0000 mg | Freq: Four times a day (QID) | INTRAMUSCULAR | Status: AC | PRN

## 2011-08-23 MED ORDER — SODIUM CHLORIDE 0.9 % IJ SOLN
INTRAMUSCULAR | Status: AC
  Filled 2011-08-23: qty 3

## 2011-08-23 MED ORDER — DEXTROSE 5 % IV SOLN: 1.0000 g | INTRAVENOUS | Status: DC

## 2011-08-23 MED ORDER — BOOST / RESOURCE BREEZE PO LIQD: 1.0000 | Freq: Two times a day (BID) | ORAL | Status: DC

## 2011-08-23 MED ORDER — HYDROMORPHONE HCL PF 1 MG/ML IJ SOLN: 1.0000 mg | INTRAMUSCULAR | Status: AC | PRN

## 2011-08-23 MED ORDER — PANTOPRAZOLE SODIUM 40 MG PO TBEC: 40.0000 mg | DELAYED_RELEASE_TABLET | Freq: Every day | ORAL | Status: DC

## 2011-08-23 MED ORDER — METRONIDAZOLE IN NACL 5-0.79 MG/ML-% IV SOLN: 500.0000 mg | Freq: Three times a day (TID) | INTRAVENOUS | Status: DC

## 2011-08-23 MED ORDER — HYDROMORPHONE HCL PF 1 MG/ML IJ SOLN
2.0000 mg | Freq: Once | INTRAMUSCULAR | Status: AC
  Administered 2011-08-23: 2 mg via INTRAVENOUS
  Filled 2011-08-23: qty 2

## 2011-08-23 NOTE — Discharge Summary (Signed)
Physician Discharge Summary/transfer summary  KWANE ROHL MRN: [id number] DOB/AGE: 11-21-1970 41 y.o.  PCP: Maximiano Coss, MD, MD   Admit date: 08/18/2011 Discharge date: 08/23/2011  Discharge Diagnoses:  (The patient is being transferred to [facility] in the care of general surgeon Dr. Mignon Pine). 1. Complicated acute diverticulitis of the sigmoid colon with pericolic inflammatory changes of the sigmoid mesocolon compatible with acute diverticulitis; two small extraluminal gas and fluid collections seen in the pelvis, potentially communicating, compatible with diverticular abscesses; extensive pericolonic inflammation extending into the left internal inguinal ring with a left inguinal hernia containing fat; questionable wall thickening of the rectum versus additional inflammation or collapse state, per CT scan of the abdomen and pelvis on 08/18/2011. 2. Mild right hydronephrosis and hydroureter; 2 right ureteral calculi; 3 mm diameter calculus at the distal right ureter and 2 mm calculus at the right ureterovesical junction, per CT of the abdomen and pelvis. 3. History of ureterolithiasis, status post lithotripsy 2 weeks ago. Patient was scheduled to followup with his urologist and [facility] next week. Surgeons at [facility] may want to consider coordinating care with urology care. 4. Thrombocytosis, secondary to inflammatory process. Platelet count ranged from 499-704. 5. Anemia, secondary to the dilutional effects of the IV fluids and acute disease. Hemoglobin 11.4 at the time of discharge. 6. Transient hyponatremia, resolved. 7. History of migraine headaches, on propanolol. 8. Benign hypertension. 9. ADD. 10. Penicillin allergy with anaphylaxis.   Medication List  As of 08/23/2011  8:44 AM   STOP taking these medications         docusate sodium 100 MG capsule      HYDROcodone-acetaminophen 10-500 MG per tablet      losartan 50 MG tablet     mesalamine 400 MG EC tablet      omeprazole 20 MG capsule      oxyCODONE-acetaminophen 10-325 MG per tablet      [facility] Caps         TAKE these medications         amphetamine-dextroamphetamine 30 MG tablet   Commonly known as: ADDERALL   Take 30 mg by mouth 2 (two) times daily.      cyclobenzaprine 5 MG tablet   Commonly known as: FLEXERIL   Take 5 mg by mouth 3 (three) times daily as needed. Muscle spasm      dextrose 5 % SOLN 50 mL with cefTRIAXone 1 G SOLR 1 g   Inject 1 g into the vein daily.      feeding supplement Liqd   Take 1 Container by mouth 2 (two) times daily between meals.      HYDROmorphone 1 MG/ML Soln injection   Commonly known as: DILAUDID   Inject 1 mL (1 mg total) into the vein every 2 (two) hours as needed for pain.      metroNIDAZOLE 5-0.79 MG/ML-% IVPB   Commonly known as: FLAGYL   Inject 100 mLs (500 mg total) into the vein every 8 (eight) hours.      ondansetron 4 MG/2ML Soln injection   Commonly known as: ZOFRAN   Inject 2 mLs (4 mg total) into the vein every 6 (six) hours as needed for nausea.      pantoprazole 40 MG tablet   Commonly known as: PROTONIX   Take 1 tablet (40 mg total) by mouth daily at 12 noon.      propranolol 80 MG tablet   Commonly known as: INDERAL  Take 80 mg by mouth 2 (two) times daily.      Tamsulosin HCl 0.4 MG Caps   Commonly known as: FLOMAX   Take 0.4 mg by mouth daily.            Discharge Condition: Stable.  Disposition: 01-Home or Self Care   Consults: Gastroenterologist, Roetta Sessions, M.D.   Significant Diagnostic Studies: Ct Abdomen Pelvis W Contrast  08/18/2011  *RADIOLOGY REPORT*  Clinical Data: Left side abdominal pain, history diverticulitis and kidney stones  CT ABDOMEN AND PELVIS WITH CONTRAST  Technique:  Multidetector CT imaging of the abdomen and pelvis was performed following the standard protocol during bolus administration of intravenous contrast. Sagittal and  coronal MPR images reconstructed from axial data set.  Contrast: OMNIPAQUE IOHEXOL 300 MG/ML  SOLN. Dilute oral contrast.  Comparison: None Correlation:  Abdominal radiographs 08/18/2011  Findings: Minimal subpleural atelectasis at lung bases. Mild right hydronephrosis and hydroureter with slight delay in right nephrogram versus left. Two right ureteral calculi are identified: 3 mm diameter distal right ureter image 71 and at right ureterovesicle junction 2 mm diameter image 76. No additional urinary tract calcifications or left side hydronephrosis/hydroureter. Liver, spleen, pancreas, kidneys, and adrenal glands otherwise normal appearance.  Significant wall thickening of the sigmoid colon with pericolic inflammatory changes of the sigmoid mesocolon compatible with acute diverticulitis. Two small extraluminal gas and fluid collections are seen in the pelvis, potentially communicating, compatible with diverticular abscesses, larger measuring 3.8 x 2.1 x 2.5 cm image 75, smaller measuring 1.4 x 1.4 x 1.7 cm image 71. Extensive pericolic inflammation extends into the left internal inguinal ring with a left inguinal hernia containing fat. Low attenuation is seen within the left inguinal canal, 4 cm greatest diameter, question free fluid, hydrocele, cannot exclude infection.  Dilated right internal inguinal ring without inflammation or bowel herniation. Questionable wall thickening of the rectum versus additional inflammation or collapsed state. Stomach and remaining bowel loops normal appearance. No definite free intraperitoneal air identified. No mass, adenopathy, or acute osseous findings.  IMPRESSION: Right hydronephrosis and hydroureter secondary to 2 distal right ureteral calculi as above. Acute sigmoid diverticulitis with extensive pericolic inflammatory changes and evidence of two small versus single communicating diverticular abscess collections. Extension of inflammatory changes and fluid along a  visualized fat containing left inguinal hernia into the left inguinal canal.  Findings called to Dr. Adriana Simas on 08/18/2011 at 1130 hours.  Original Report Authenticated By: Lollie Marrow, M.D.   Dg Abd Acute W/chest  08/18/2011  *RADIOLOGY REPORT*  Clinical Data: Left side abdominal pain, history kidney stones with lithotripsy, history diverticulitis  ACUTE ABDOMEN SERIES (ABDOMEN 2 VIEW & CHEST 1 VIEW)  Comparison: None  Findings: Normal heart size, mediastinal contours, and pulmonary vascularity. Lungs appear slightly emphysematous without infiltrate or effusion. Questionable nodular density versus nipple shadow 11 x 9 mm right lung base. No pneumothorax. Gas and stool throughout colon. Air filled loops of small bowel in the left mid abdomen, slightly prominent size up to 4.5 cm diameter, nonspecific. No definite bowel wall thickening or free intraperitoneal air. Scattered pelvic phleboliths. No definite urinary tract calcification. No acute osseous findings.  IMPRESSION: Question right nipple shadow versus right lung base nodule; recommend repeat PA chest radiograph with nipple markers to exclude pulmonary nodule. Nonspecific air filled mildly distended loops of small bowel wall in left mid abdomen, though gas and stool are present throughout colon. Findings could represent ileus though early or partial small bowel obstruction not completely  excluded. No other acute intra abdominal findings identified.  Original Report Authenticated By: Lollie Marrow, M.D.     Microbiology: No results found for this or any previous visit (from the past 240 hour(s)).   Labs: Results for orders placed during the hospital encounter of 08/18/11 (from the past 48 hour(s))  OCCULT BLOOD X 1 CARD TO LAB, STOOL     Status: Normal   Collection Time   08/23/11  2:22 AM      Component Value Range Comment   Fecal Occult Bld NEGATIVE     COMPREHENSIVE METABOLIC PANEL     Status: Abnormal   Collection Time   08/23/11  6:33 AM       Component Value Range Comment   Sodium 135  135 - 145 (mEq/L)    Potassium 4.0  3.5 - 5.1 (mEq/L)    Chloride 101  96 - 112 (mEq/L)    CO2 29  19 - 32 (mEq/L)    Glucose, Bld 104 (*) 70 - 99 (mg/dL)    BUN 4 (*) 6 - 23 (mg/dL)    Creatinine, Ser 4.09  0.50 - 1.35 (mg/dL)    Calcium 9.6  8.4 - 10.5 (mg/dL)    Total Protein 6.7  6.0 - 8.3 (g/dL)    Albumin 3.0 (*) 3.5 - 5.2 (g/dL)    AST 20  0 - 37 (U/L)    ALT 11  0 - 53 (U/L)    Alkaline Phosphatase 77  39 - 117 (U/L)    Total Bilirubin 0.1 (*) 0.3 - 1.2 (mg/dL)    GFR calc non Af Amer >90  >90 (mL/min)    GFR calc Af Amer >90  >90 (mL/min)   CBC     Status: Abnormal   Collection Time   08/23/11  6:33 AM      Component Value Range Comment   WBC 8.7  4.0 - 10.5 (K/uL)    RBC 4.09 (*) 4.22 - 5.81 (MIL/uL)    Hemoglobin 11.4 (*) 13.0 - 17.0 (g/dL)    HCT 81.1 (*) 91.4 - 52.0 (%)    MCV 85.3  78.0 - 100.0 (fL)    MCH 27.9  26.0 - 34.0 (pg)    MCHC 32.7  30.0 - 36.0 (g/dL)    RDW 78.2  95.6 - 21.3 (%)    Platelets 538 (*) 150 - 400 (K/uL)      HPI :SHAWNE ESKELSON is an 41 y.o. male with history of diverticulitis diagnosed in January who presented with left lower quadrant pain. He had outpatient treatment of diverticulitis in January with ciprofloxacin and metronidazole. He was subsequently admitted to [facility] in [place], [place] with complicated diverticulitis. He had abscess formation and was treated medically. He received IV ciprofloxacin and Flagyl in the hospital, went home with a PICC line, had IV ciprofloxacin and Flagyl at home which was subsequently changed to Tygacil. He never completely recovered and had mild abdominal pain since then. However, his left lower quadrant pain had become much more severe. His appetite had been poorand he's lost about 40 pounds. He has sometimes blood-tinged stool with mucus. He has chronic hemorrhoids and felt that the blood was related to this. He was evaluated by a surgeon at [facility]. He went to [facility] to see a gastroenterologist who agreed with the management. Patient  had no nausea or vomiting. He has had several CAT scans since January.  CT scan of the abdomen and pelvis on  admission in the ED, revealed diverticulitis with 2 small abscesses versus one communicating abscess. Radiologist, Dr. Juan Quam compared this CAT scan to the one done at [facility]. The location of the abscesses were about the same, but the size had gotten a bit bigger since March. There was no free air. The abscesses were too small and poorly positioned for percutaneous drainage according to radiology. The patient was hoping to avoid surgery. Prior to January, he had never had diverticulitis. He's never had a colonoscopy.  Also, he reported aggressive activities with an adult toy with his husband. He also reported protected sex with a different partner last fall, and is concerned about HIV or other STDs. He tested negative for HIV last September.  In addition, he has a history of nephrolithiasis and had lithotripsy over a week ago in [place]. He has a followup appointment scheduled soon with his urologist.   HOSPITAL COURSE: The patient was started on empiric antibiotic treatment with IV Rocephin and Flagyl. Gentle IV fluids were started. Pain management was started with as needed IV Dilaudid. When the when necessary hydromorphone was not successfully eating his pain, it was switched to PCA. Following this change, the patient's pain was under better control. His nausea was treated with as needed Zofran. Initially, his blood pressures were on the low-normal side. Therefore, his antihypertensive medications were initially withheld. Propranolol was eventually restarted due to his history of migraine headaches. Gastroenterologist, Dr. Jena Gauss was consulted. He reviewed the CT scans with the radiologist. He recommended a surgical consultation for surgical drainage as the patient may need a diverting  colostomy. In other words, Dr. Jena Gauss did not believe that this process was amenable to percutaneous drainage.  The patient was informed of Dr. Luvenia Starch recommendations. He requested a referral to a colorectal surgeon. Therefore, plans were made to obtain the appropriate specialist at an in [facility]. Subsequently, Dr. Lendell Caprice, was able to contact Dr. Alona Bene, general surgeon on call at [facility]. Following their discussion, he agreed to accept the patient today on 08/23/2011. Dr. Mignon Pine will consult colorectal surgery once the patient arrives at [facility].  The patient refused a PICC line and TPN. He requested advancement of his diet to a full liquid diet. He did tolerate the advancement with minimal increase in abdominal pain. He continued to have loose bowel movements, nonbloody or black or tarry. He remained hemodynamically stable and afebrile with exception of transient hypotension which was related to volume depletion in the setting of propanolol. Parameters were placed on propanolol. He was restarted on IV fluids. His blood pressure improved. He also was noted to have thrombocytosis, which was likely secondary to the inflammatory processes. He had transient bradycardia which resolved. He had hyponatremia which resolved with IV fluids. During the hospitalization, he received Rocephin and Flagyl for 5 days.   Discharge Exam:  Blood pressure 99/63, pulse 53, temperature 97.8 F (36.6 C), temperature source Oral, resp. rate 18, height 6\' 4"  (1.93 m), weight 93.895 kg (207 lb), SpO2 98.00%.  Lungs: Clear to auscultation bilaterally. Heart: S1, S2, with borderline bradycardia. Abdomen: Positive bowel sounds, soft, mildly to moderately tender at the left lower quadrant. No rigidity, no distention. Extremity: No pedal edema.     Discharge Orders    Future Orders Please Complete By Expires   Increase activity slowly      Discharge  instructions      Comments:   The patient is on  a full liquid diet.      Total discharge time: 40 minutes.     Signed: Shenandoah Yeats 08/23/2011, 8:44 AM

## 2011-08-23 NOTE — Progress Notes (Signed)
Patient being transferred to [facility] via ground transport.  Is in stable condition. Medication with Dilaudid 2mg  IV and Zofran 4mg  PO. Family at bedside to follow transport.

## 2012-09-03 DIAGNOSIS — R079 Chest pain, unspecified: Secondary | ICD-10-CM

## 2013-02-02 IMAGING — CR DG ABDOMEN ACUTE W/ 1V CHEST
3 series · 3 of 3 positions shown · non-contrast
Comparison: None

CLINICAL DATA: Left side abdominal pain, history kidney stones with
lithotripsy, history diverticulitis

ACUTE ABDOMEN SERIES (ABDOMEN 2 VIEW & CHEST 1 VIEW)

[view not recorded (1 of 3)]
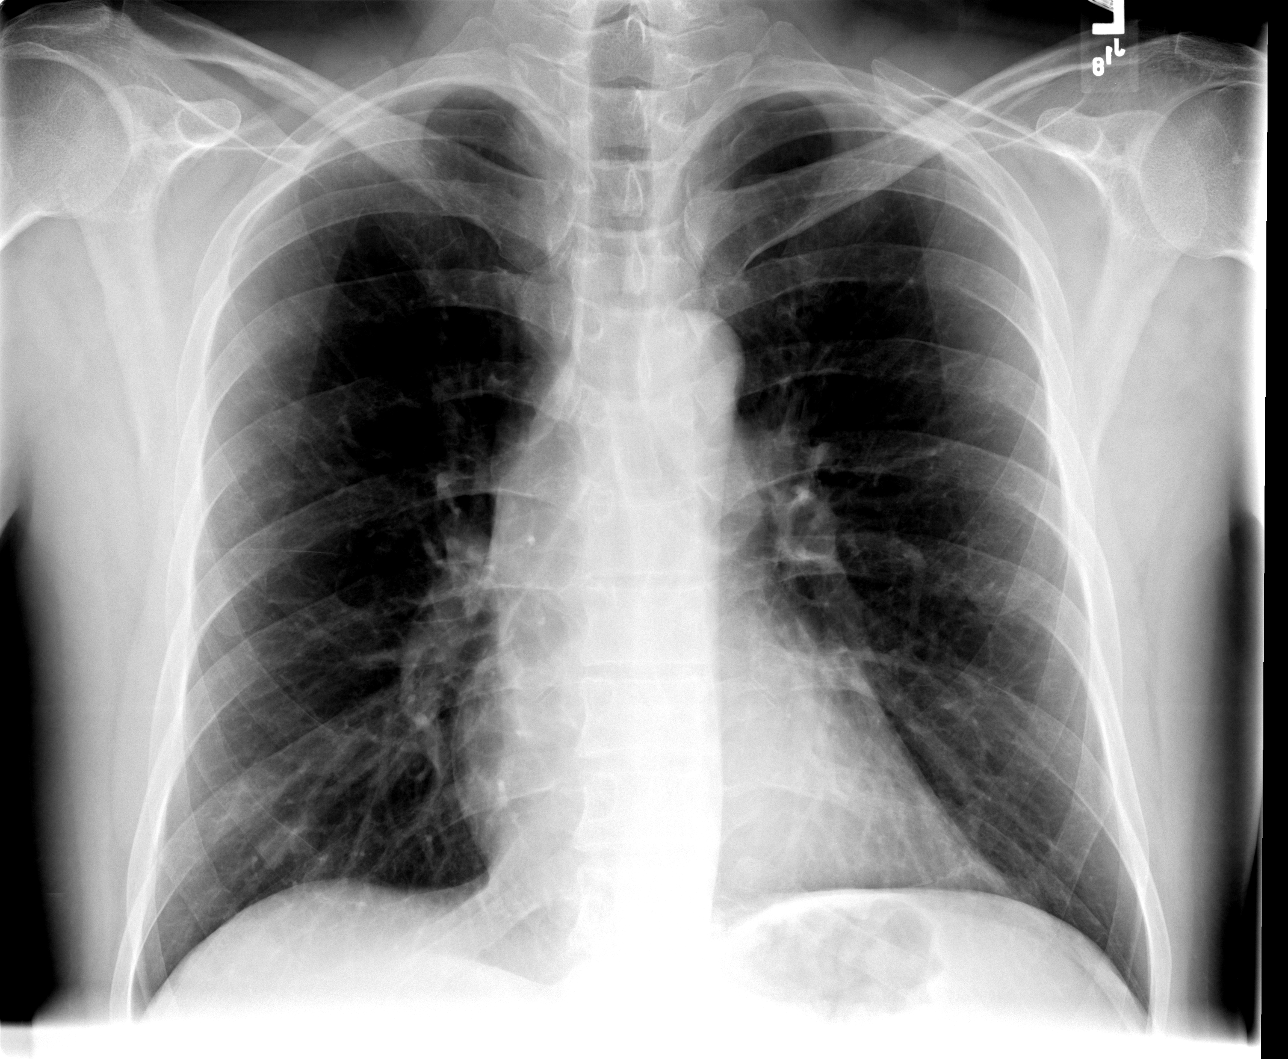

[view not recorded (2 of 3)]
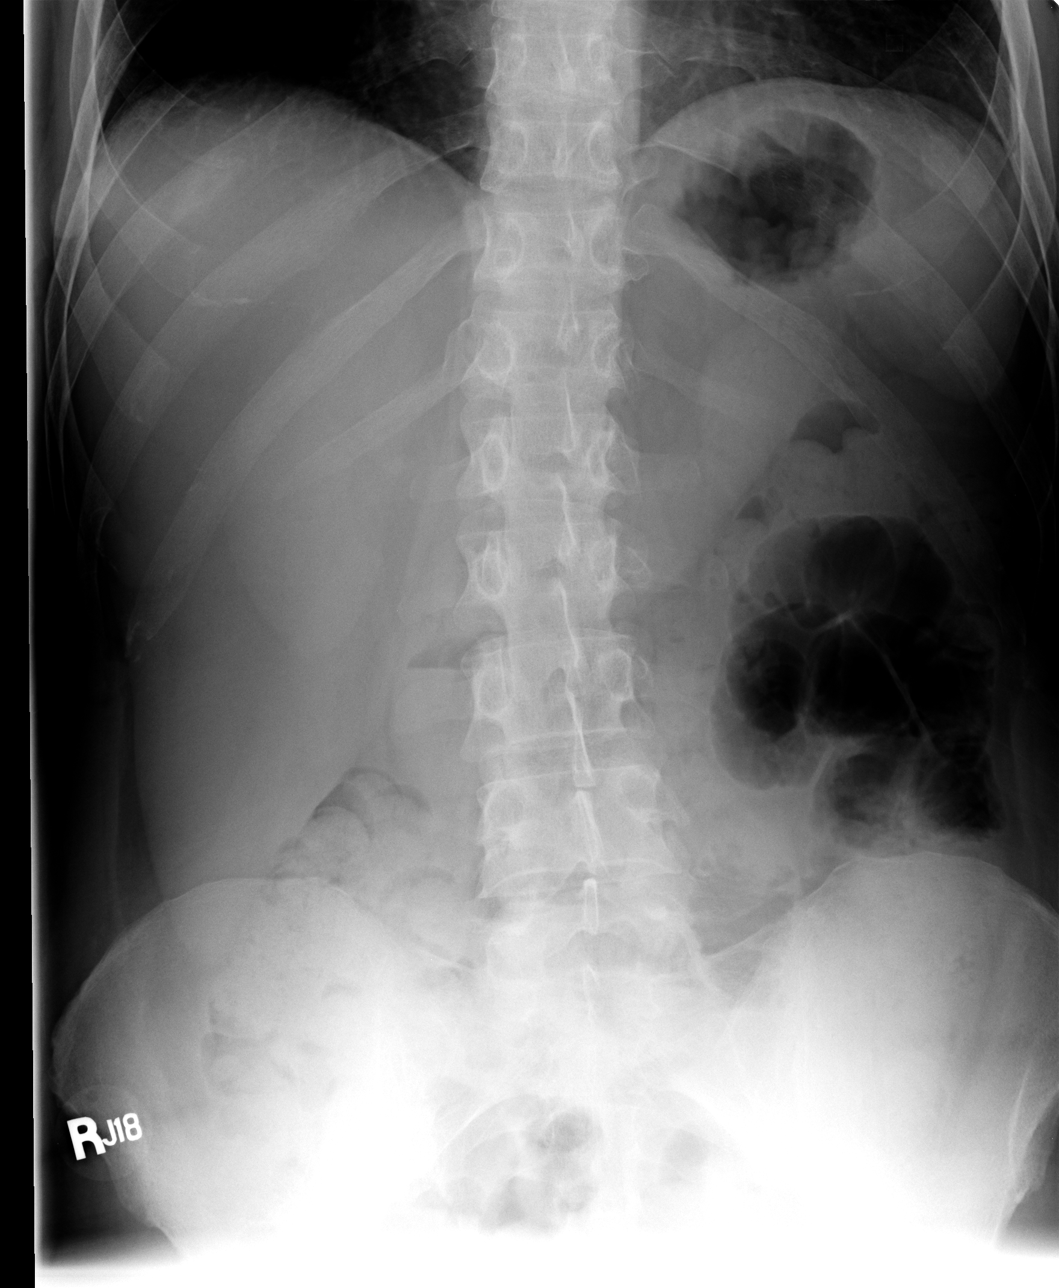

[view not recorded (3 of 3)]
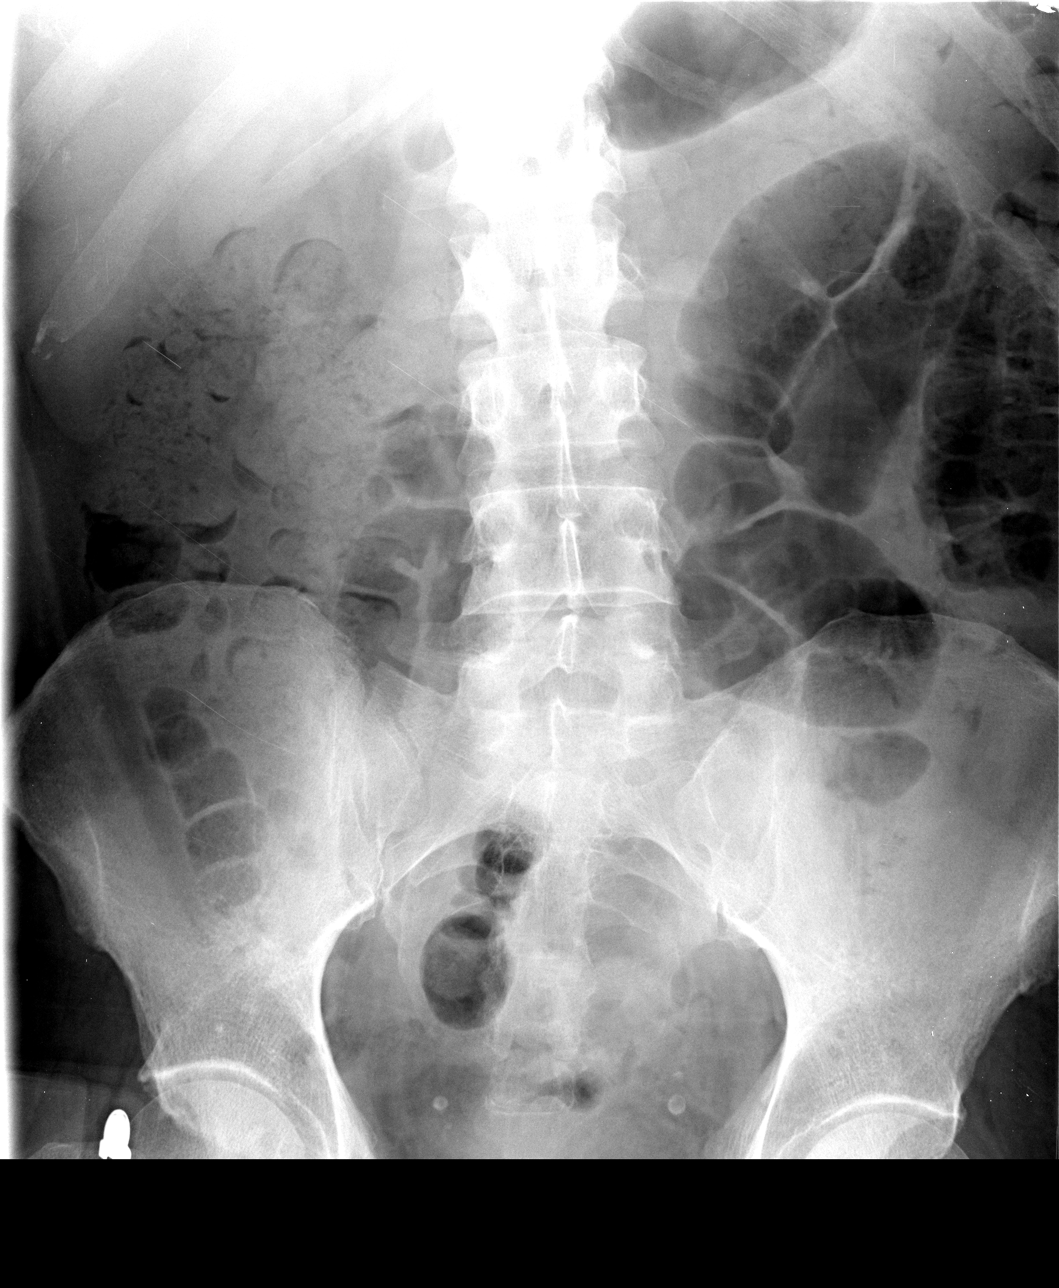

[3 of 3 positions shown; findings below may reference images not displayed]

FINDINGS: Normal heart size, mediastinal contours, and pulmonary vascularity.
Lungs appear slightly emphysematous without infiltrate or effusion.
Questionable nodular density versus nipple shadow 11 x 9 mm right
lung base.
No pneumothorax.
Gas and stool throughout colon.
Air filled loops of small bowel in the left mid abdomen, slightly
prominent size up to 4.5 cm diameter, nonspecific.
No definite bowel wall thickening or free intraperitoneal air.
Scattered pelvic phleboliths.
No definite urinary tract calcification.
No acute osseous findings.
IMPRESSION: Question right nipple shadow versus right lung base nodule;
recommend repeat PA chest radiograph with nipple markers to exclude
pulmonary nodule.
Nonspecific air filled mildly distended loops of small bowel wall
in left mid abdomen, though gas and stool are present throughout
colon.
Findings could represent ileus though early or partial small bowel
obstruction not completely excluded.
No other acute intra abdominal findings identified.

## 2013-08-28 ENCOUNTER — Emergency Department (HOSPITAL_COMMUNITY): Payer: BC Managed Care – PPO

## 2013-08-28 ENCOUNTER — Emergency Department (HOSPITAL_COMMUNITY)
Admission: EM | Admit: 2013-08-28 | Discharge: 2013-08-28 | Disposition: A | Discharge status: Home / Self Care | Payer: BC Managed Care – PPO | Attending: Emergency Medicine | Admitting: Emergency Medicine

## 2013-08-28 ENCOUNTER — Encounter (HOSPITAL_COMMUNITY): Payer: Self-pay | Admitting: Emergency Medicine

## 2013-08-28 DIAGNOSIS — R51 Headache: Secondary | ICD-10-CM

## 2013-08-28 DIAGNOSIS — Z8659 Personal history of other mental and behavioral disorders: Secondary | ICD-10-CM | POA: Insufficient documentation

## 2013-08-28 DIAGNOSIS — Z79899 Other long term (current) drug therapy: Secondary | ICD-10-CM | POA: Insufficient documentation

## 2013-08-28 DIAGNOSIS — M542 Cervicalgia: Secondary | ICD-10-CM | POA: Insufficient documentation

## 2013-08-28 DIAGNOSIS — Z862 Personal history of diseases of the blood and blood-forming organs and certain disorders involving the immune mechanism: Secondary | ICD-10-CM | POA: Insufficient documentation

## 2013-08-28 DIAGNOSIS — R519 Headache, unspecified: Secondary | ICD-10-CM

## 2013-08-28 DIAGNOSIS — Z8719 Personal history of other diseases of the digestive system: Secondary | ICD-10-CM | POA: Insufficient documentation

## 2013-08-28 DIAGNOSIS — Z880 Allergy status to penicillin: Secondary | ICD-10-CM | POA: Insufficient documentation

## 2013-08-28 DIAGNOSIS — I10 Essential (primary) hypertension: Secondary | ICD-10-CM | POA: Insufficient documentation

## 2013-08-28 DIAGNOSIS — G43909 Migraine, unspecified, not intractable, without status migrainosus: Secondary | ICD-10-CM | POA: Insufficient documentation

## 2013-08-28 DIAGNOSIS — Z87442 Personal history of urinary calculi: Secondary | ICD-10-CM | POA: Insufficient documentation

## 2013-08-28 LAB — CBC
HCT: 40.2 % (ref 39.0–52.0)
Hemoglobin: 13.6 g/dL (ref 13.0–17.0)
MCH: 29.5 pg (ref 26.0–34.0)
MCHC: 33.8 g/dL (ref 30.0–36.0)
MCV: 87.2 fL (ref 78.0–100.0)
PLATELETS: 324 10*3/uL (ref 150–400)
RBC: 4.61 MIL/uL (ref 4.22–5.81)
RDW: 12.8 % (ref 11.5–15.5)
WBC: 9.6 10*3/uL (ref 4.0–10.5)

## 2013-08-28 LAB — BASIC METABOLIC PANEL
BUN: 13 mg/dL (ref 6–23)
CALCIUM: 9.8 mg/dL (ref 8.4–10.5)
CO2: 28 mEq/L (ref 19–32)
CREATININE: 1.11 mg/dL (ref 0.50–1.35)
Chloride: 100 mEq/L (ref 96–112)
GFR calc Af Amer: >90 mL/min (ref >90)
GFR, EST NON AFRICAN AMERICAN: 79 mL/min — AB (ref >90)
Glucose, Bld: 89 mg/dL (ref 70–99)
Potassium: 4.4 mEq/L (ref 3.7–5.3)
SODIUM: 139 meq/L (ref 137–147)

## 2013-08-28 MED ORDER — TRAMADOL HCL 50 MG PO TABS: 50.0000 mg | ORAL_TABLET | Freq: Four times a day (QID) | ORAL | Status: DC | PRN

## 2013-08-28 MED ORDER — DIPHENHYDRAMINE HCL 50 MG/ML IJ SOLN
25.0000 mg | Freq: Once | INTRAMUSCULAR | Status: AC
  Administered 2013-08-28: 25 mg via INTRAVENOUS
  Filled 2013-08-28: qty 1

## 2013-08-28 MED ORDER — METOCLOPRAMIDE HCL 5 MG/ML IJ SOLN
10.0000 mg | Freq: Once | INTRAMUSCULAR | Status: AC
  Administered 2013-08-28: 10 mg via INTRAVENOUS
  Filled 2013-08-28: qty 2

## 2013-08-28 MED ORDER — KETOROLAC TROMETHAMINE 30 MG/ML IJ SOLN
30.0000 mg | Freq: Once | INTRAMUSCULAR | Status: AC
  Administered 2013-08-28: 30 mg via INTRAVENOUS
  Filled 2013-08-28: qty 1

## 2013-08-28 MED ORDER — SODIUM CHLORIDE 0.9 % IV BOLUS (SEPSIS)
1000.0000 mL | Freq: Once | INTRAVENOUS | Status: AC
  Administered 2013-08-28: 1000 mL via INTRAVENOUS

## 2013-08-28 NOTE — ED Provider Notes (Signed)
CSN: [id number]     Arrival date & time 08/28/13  2111 History   First MD Initiated Contact with Patient 08/28/13 2121 This chart was scribed for Maudry Diego, MD by Anastasia Pall, ED Scribe. This patient was seen in room APA07/APA07 and the patient's care was started at 9:35 PM.     Chief Complaint  Patient presents with  . Headache   (Consider location/radiation/quality/duration/timing/severity/associated sxs/prior Treatment) Patient is a 43 y.o. male presenting with headaches. The history is provided by the patient. No language interpreter was used.  Headache Pain location:  Generalized Quality: crushing. Severity currently:  9/10 Severity at highest:  9/10 Onset quality:  Sudden Duration:  1 day Timing:  Constant Progression:  Waxing and waning Chronicity:  Recurrent Similar to prior headaches: no   Associated symptoms: neck pain, neck stiffness and photophobia   Associated symptoms: no abdominal pain, no back pain, no congestion, no cough, no diarrhea, no fatigue, no fever, no seizures, no sinus pressure and no vomiting    HPI Comments: Wesley Henson is a 43 y.o. male with h/o migraines, who presents to the Emergency Department complaining of a constant, 9/10, crushing headache throughout his entire skull, onset yesterday and worse this morning before work. He reports his headache feels different than his h/o headaches, stating that his pain is usually one sided with his migraines. He has tried Tylenol with only temporary relief. He states he has been somnolent and lethargic over the past week. He has associated neck pain and stiffness. He states that quick movements of his neck exacerbate his neck pain. He denies recent tick bites, fever, chills, cough, and any other associated symptoms.   PCP - Yvone Neu, MD  Past Medical History  Diagnosis Date  . Kidney calculi   . Diverticula of colon     complicated by abscess, since 03/2011  . Hypertension   . Migraine    . Thrombocytosis 08/23/2011  . Diverticulitis of large intestine with perforation 08/18/2011  . Ureterolithiasis 08/18/2011  . ADD (attention deficit disorder with hyperactivity)   . Penicillin allergy     History of anaphylaxis.   Past Surgical History  Procedure Laterality Date  . Wisdom tooth extraction    . Lithotripsy    . Colon surgery     Family History  Problem Relation Age of Onset  . Colon cancer Maternal Grandmother   . Diverticulitis Mother    History  Substance Use Topics  . Smoking status: Never Smoker   . Smokeless tobacco: Not on file  . Alcohol Use: No    Review of Systems  Constitutional: Positive for activity change (lethargic). Negative for fever, appetite change and fatigue.  HENT: Negative for congestion, ear discharge and sinus pressure.   Eyes: Positive for photophobia. Negative for discharge.  Respiratory: Negative for cough.   Cardiovascular: Negative for chest pain.  Gastrointestinal: Negative for vomiting, abdominal pain and diarrhea.  Genitourinary: Negative for frequency and hematuria.  Musculoskeletal: Positive for neck pain and neck stiffness. Negative for back pain.  Skin: Negative for rash.  Neurological: Positive for headaches. Negative for seizures.  Psychiatric/Behavioral: Negative for hallucinations.    Allergies  Penicillins  Home Medications   Prior to Admission medications   Medication Sig Start Date End Date Taking? Authorizing Provider  pantoprazole (PROTONIX) 40 MG tablet Take 1 tablet (40 mg total) by mouth daily at 12 noon. 08/23/11 08/22/12  Rexene Alberts, MD  propranolol (INDERAL) 80 MG tablet Take 80 mg by mouth  2 (two) times daily.    Historical Provider, MD  Tamsulosin HCl (FLOMAX) 0.4 MG CAPS Take 0.4 mg by mouth daily.     Historical Provider, MD   BP 156/87  Pulse 64  Temp(Src) 98.4 F (36.9 C) (Oral)  Resp 20  Ht 6\' 4"  (1.93 m)  Wt 235 lb (106.595 kg)  BMI 28.62 kg/m2  SpO2 100% Physical Exam   Constitutional: He is oriented to person, place, and time. He appears well-developed.  HENT:  Head: Normocephalic.  Eyes: Conjunctivae and EOM are normal. No scleral icterus.  Neck: Neck supple. Muscular tenderness present. No thyromegaly present.  Minor bilateral neck tenderness.   Cardiovascular: Normal rate, regular rhythm, normal heart sounds and intact distal pulses.  Exam reveals no gallop and no friction rub.   No murmur heard. Pulmonary/Chest: Effort normal and breath sounds normal. No stridor. No respiratory distress. He has no wheezes. He has no rales. He exhibits no tenderness.  Musculoskeletal: Normal range of motion. He exhibits no edema.  Lymphadenopathy:    He has no cervical adenopathy.  Neurological: He is alert and oriented to person, place, and time. He exhibits normal muscle tone. Coordination normal.  Skin: No rash noted. No erythema.  Psychiatric: He has a normal mood and affect. His behavior is normal.   ED Course  Procedures (including critical care time)  DIAGNOSTIC STUDIES: Oxygen Saturation is 100% on room air, normal by my interpretation.    COORDINATION OF CARE: 9:39 PM-Discussed treatment plan with pt at bedside and pt agreed to plan.   Results for orders placed during the hospital encounter of 08/28/13  CBC      Result Value Ref Range   WBC 9.6  4.0 - 10.5 K/uL   RBC 4.61  4.22 - 5.81 MIL/uL   Hemoglobin 13.6  13.0 - 17.0 g/dL   HCT 40.2  39.0 - 52.0 %   MCV 87.2  78.0 - 100.0 fL   MCH 29.5  26.0 - 34.0 pg   MCHC 33.8  30.0 - 36.0 g/dL   RDW 12.8  11.5 - 15.5 %   Platelets 324  150 - 400 K/uL  BASIC METABOLIC PANEL      Result Value Ref Range   Sodium 139  137 - 147 mEq/L   Potassium 4.4  3.7 - 5.3 mEq/L   Chloride 100  96 - 112 mEq/L   CO2 28  19 - 32 mEq/L   Glucose, Bld 89  70 - 99 mg/dL   BUN 13  6 - 23 mg/dL   Creatinine, Ser 1.11  0.50 - 1.35 mg/dL   Calcium 9.8  8.4 - 10.5 mg/dL   GFR calc non Af Amer 79 (*) >90 mL/min   GFR calc  Af Amer >90  >90 mL/min   Ct Head Wo Contrast  08/28/2013   CLINICAL DATA:  Headache.  History of migraines with worsening.  EXAM: CT HEAD WITHOUT CONTRAST  TECHNIQUE: Contiguous axial images were obtained from the base of the skull through the vertex without intravenous contrast.  COMPARISON:  11/13/2010  FINDINGS: Skull and Sinuses:Rounded partially visualized density in the right maxillary sinus is indeterminate due to partial visualization, but is most likely a postinflammatory mucous retention cysts. No sinus, mastoid, were middle ear effusion.  Orbits: No acute abnormality.  Brain: No evidence of acute abnormality, such as acute infarction, hemorrhage, hydrocephalus, or mass lesion/mass effect.  IMPRESSION: Negative head CT.   Electronically Signed   By: Jorje Guild  M.D.   On: 08/28/2013 22:44     EKG Interpretation None     Medications  sodium chloride 0.9 % bolus 1,000 mL (1,000 mLs Intravenous New Bag/Given 08/28/13 2207)  metoCLOPramide (REGLAN) injection 10 mg (10 mg Intravenous Given 08/28/13 2206)  ketorolac (TORADOL) 30 MG/ML injection 30 mg (30 mg Intravenous Given 08/28/13 2206)  diphenhydrAMINE (BENADRYL) injection 25 mg (25 mg Intravenous Given 08/28/13 2206)   MDM   Final diagnoses:  None   Pt improved with tx.    Headache resolve.  Pt to follow up if needed The chart was scribed for me under my direct supervision.  I personally performed the history, physical, and medical decision making and all procedures in the evaluation of this patient.Maudry Diego, MD 08/28/13 [phone]

## 2013-08-28 NOTE — ED Notes (Signed)
Pt c/o headache x 2 days. Pt states this pain feels different than normal migraines.

## 2013-08-28 NOTE — Discharge Instructions (Signed)
Follow up with your md if any problems °

## 2014-09-02 ENCOUNTER — Emergency Department (HOSPITAL_COMMUNITY)
Admission: EM | Admit: 2014-09-02 | Discharge: 2014-09-02 | Disposition: A | Discharge status: Home / Self Care | Payer: BLUE CROSS/BLUE SHIELD | Attending: Emergency Medicine | Admitting: Emergency Medicine

## 2014-09-02 ENCOUNTER — Encounter (HOSPITAL_COMMUNITY): Payer: Self-pay | Admitting: Emergency Medicine

## 2014-09-02 ENCOUNTER — Emergency Department (HOSPITAL_COMMUNITY): Payer: BLUE CROSS/BLUE SHIELD

## 2014-09-02 DIAGNOSIS — Z8719 Personal history of other diseases of the digestive system: Secondary | ICD-10-CM | POA: Insufficient documentation

## 2014-09-02 DIAGNOSIS — G43909 Migraine, unspecified, not intractable, without status migrainosus: Secondary | ICD-10-CM | POA: Diagnosis not present

## 2014-09-02 DIAGNOSIS — Z87442 Personal history of urinary calculi: Secondary | ICD-10-CM | POA: Insufficient documentation

## 2014-09-02 DIAGNOSIS — Z8659 Personal history of other mental and behavioral disorders: Secondary | ICD-10-CM | POA: Diagnosis not present

## 2014-09-02 DIAGNOSIS — I10 Essential (primary) hypertension: Secondary | ICD-10-CM | POA: Insufficient documentation

## 2014-09-02 DIAGNOSIS — Z9889 Other specified postprocedural states: Secondary | ICD-10-CM | POA: Insufficient documentation

## 2014-09-02 DIAGNOSIS — R1011 Right upper quadrant pain: Secondary | ICD-10-CM | POA: Diagnosis present

## 2014-09-02 DIAGNOSIS — Z79899 Other long term (current) drug therapy: Secondary | ICD-10-CM | POA: Diagnosis not present

## 2014-09-02 DIAGNOSIS — Z880 Allergy status to penicillin: Secondary | ICD-10-CM | POA: Insufficient documentation

## 2014-09-02 DIAGNOSIS — Z862 Personal history of diseases of the blood and blood-forming organs and certain disorders involving the immune mechanism: Secondary | ICD-10-CM | POA: Diagnosis not present

## 2014-09-02 DIAGNOSIS — R109 Unspecified abdominal pain: Secondary | ICD-10-CM

## 2014-09-02 LAB — CBC WITH DIFFERENTIAL/PLATELET
BASOS ABS: 0 10*3/uL (ref 0.0–0.1)
Basophils Relative: 0 % (ref 0–1)
EOS ABS: 0.3 10*3/uL (ref 0.0–0.7)
EOS PCT: 3 % (ref 0–5)
HCT: 41 % (ref 39.0–52.0)
Hemoglobin: 13.7 g/dL (ref 13.0–17.0)
LYMPHS PCT: 28 % (ref 12–46)
Lymphs Abs: 3 10*3/uL (ref 0.7–4.0)
MCH: 29.3 pg (ref 26.0–34.0)
MCHC: 33.4 g/dL (ref 30.0–36.0)
MCV: 87.8 fL (ref 78.0–100.0)
MONO ABS: 0.9 10*3/uL (ref 0.1–1.0)
MONOS PCT: 9 % (ref 3–12)
NEUTROS ABS: 6.5 10*3/uL (ref 1.7–7.7)
Neutrophils Relative %: 60 % (ref 43–77)
Platelets: 343 10*3/uL (ref 150–400)
RBC: 4.67 MIL/uL (ref 4.22–5.81)
RDW: 13.3 % (ref 11.5–15.5)
WBC: 10.7 10*3/uL — AB (ref 4.0–10.5)

## 2014-09-02 LAB — COMPREHENSIVE METABOLIC PANEL
ALT: 33 U/L (ref 17–63)
AST: 29 U/L (ref 15–41)
Albumin: 4.6 g/dL (ref 3.5–5.0)
Alkaline Phosphatase: 66 U/L (ref 38–126)
Anion gap: 10 (ref 5–15)
BILIRUBIN TOTAL: 0.9 mg/dL (ref 0.3–1.2)
BUN: 13 mg/dL (ref 6–20)
CALCIUM: 9.6 mg/dL (ref 8.9–10.3)
CHLORIDE: 103 mmol/L (ref 101–111)
CO2: 26 mmol/L (ref 22–32)
CREATININE: 1.18 mg/dL (ref 0.61–1.24)
GFR calc non Af Amer: >60 mL/min (ref >60)
GLUCOSE: 83 mg/dL (ref 65–99)
Potassium: 3.8 mmol/L (ref 3.5–5.1)
SODIUM: 139 mmol/L (ref 135–145)
Total Protein: 7.7 g/dL (ref 6.5–8.1)

## 2014-09-02 LAB — URINALYSIS, ROUTINE W REFLEX MICROSCOPIC
Bilirubin Urine: NEGATIVE
Glucose, UA: NEGATIVE mg/dL
Ketones, ur: 15 mg/dL — AB
LEUKOCYTES UA: NEGATIVE
NITRITE: NEGATIVE
PH: 5.5 (ref 5.0–8.0)
Protein, ur: NEGATIVE mg/dL
Specific Gravity, Urine: 1.025 (ref 1.005–1.030)
UROBILINOGEN UA: 0.2 mg/dL (ref 0.0–1.0)

## 2014-09-02 LAB — URINE MICROSCOPIC-ADD ON

## 2014-09-02 LAB — LIPASE, BLOOD: LIPASE: 20 U/L — AB (ref 22–51)

## 2014-09-02 MED ORDER — IOHEXOL 300 MG/ML  SOLN
100.0000 mL | Freq: Once | INTRAMUSCULAR | Status: AC | PRN
  Administered 2014-09-02: 100 mL via INTRAVENOUS

## 2014-09-02 MED ORDER — HYDROMORPHONE HCL 1 MG/ML IJ SOLN
1.0000 mg | Freq: Once | INTRAMUSCULAR | Status: AC
Start: 2014-09-02 — End: 2014-09-02
  Administered 2014-09-02: 1 mg via INTRAVENOUS
  Filled 2014-09-02: qty 1

## 2014-09-02 MED ORDER — PROMETHAZINE HCL 25 MG PO TABS: 25.0000 mg | ORAL_TABLET | Freq: Four times a day (QID) | ORAL | Status: DC | PRN

## 2014-09-02 MED ORDER — HYDROCODONE-ACETAMINOPHEN 5-325 MG PO TABS: 2.0000 | ORAL_TABLET | ORAL | Status: DC | PRN

## 2014-09-02 MED ORDER — SODIUM CHLORIDE 0.9 % IV BOLUS (SEPSIS)
1000.0000 mL | Freq: Once | INTRAVENOUS | Status: AC
  Administered 2014-09-02: 1000 mL via INTRAVENOUS

## 2014-09-02 MED ORDER — ONDANSETRON HCL 4 MG/2ML IJ SOLN
4.0000 mg | Freq: Once | INTRAMUSCULAR | Status: AC
  Administered 2014-09-02: 4 mg via INTRAVENOUS

## 2014-09-02 MED ORDER — ONDANSETRON HCL 4 MG/2ML IJ SOLN
4.0000 mg | Freq: Once | INTRAMUSCULAR | Status: DC
  Filled 2014-09-02: qty 2

## 2014-09-02 NOTE — Discharge Instructions (Signed)

## 2014-09-02 NOTE — ED Provider Notes (Signed)
CSN: [id number]     Arrival date & time 09/02/14  1844 History   First MD Initiated Contact with Patient 09/02/14 1857     Chief Complaint  Patient presents with  . Abdominal Pain     (Consider location/radiation/quality/duration/timing/severity/associated sxs/prior Treatment) HPI Comments: The patient is a 44 year old male, he has a history of a prior sigmoid colectomy secondary to diverticulitis, also has a history of multiple kidney stones in the past, has had lithotripsy in 2013. He presents today with right upper quadrant abdominal pain. He states that this started today, there has been nausea but no vomiting, he does report having lately or greasy consistency of stools for the last 3 days. There is no urinary symptoms, no hematuria, symptoms are intermittent, colicky, nothing seems to make this better, may be getting worse with position but he is unsure.  Patient is a 44 y.o. male presenting with abdominal pain. The history is provided by the patient.  Abdominal Pain   Past Medical History  Diagnosis Date  . Kidney calculi   . Diverticula of colon     complicated by abscess, since 03/2011  . Hypertension   . Migraine   . Thrombocytosis 08/23/2011  . Diverticulitis of large intestine with perforation 08/18/2011  . Ureterolithiasis 08/18/2011  . ADD (attention deficit disorder with hyperactivity)   . Penicillin allergy     History of anaphylaxis.   Past Surgical History  Procedure Laterality Date  . Wisdom tooth extraction    . Lithotripsy    . Colon surgery     Family History  Problem Relation Age of Onset  . Colon cancer Maternal Grandmother   . Diverticulitis Mother    History  Substance Use Topics  . Smoking status: Never Smoker   . Smokeless tobacco: Not on file  . Alcohol Use: Yes     Comment: rarely    Review of Systems  Gastrointestinal: Positive for abdominal pain.  All other systems reviewed and are negative.     Allergies  Penicillins  Home  Medications   Prior to Admission medications   Medication Sig Start Date End Date Taking? Authorizing Provider  albuterol (PROVENTIL HFA;VENTOLIN HFA) 108 (90 BASE) MCG/ACT inhaler Inhale 1-2 puffs into the lungs every 6 (six) hours as needed for wheezing or shortness of breath.   Yes Historical Provider, MD  losartan (COZAAR) 50 MG tablet Take 50 mg by mouth daily.   Yes Historical Provider, MD  omeprazole (PRILOSEC) 20 MG capsule Take 20 mg by mouth 2 (two) times daily.   Yes Historical Provider, MD  propranolol (INDERAL) 80 MG tablet Take 80 mg by mouth 2 (two) times daily.   Yes Historical Provider, MD  SUMAtriptan (IMITREX) 50 MG tablet Take 50 mg by mouth every 2 (two) hours as needed for migraine or headache. May repeat in 2 hours if headache persists or recurs.   Yes Historical Provider, MD  HYDROcodone-acetaminophen (NORCO/VICODIN) 5-325 MG per tablet Take 2 tablets by mouth every 4 (four) hours as needed for moderate pain. 09/02/14   Noemi Chapel, MD  pantoprazole (PROTONIX) 40 MG tablet Take 1 tablet (40 mg total) by mouth daily at 12 noon. 08/23/11 08/22/12  Rexene Alberts, MD  promethazine (PHENERGAN) 25 MG tablet Take 1 tablet (25 mg total) by mouth every 6 (six) hours as needed for nausea or vomiting. 09/02/14   Noemi Chapel, MD  traMADol (ULTRAM) 50 MG tablet Take 1 tablet (50 mg total) by mouth every 6 (six) hours as needed.  Patient not taking: Reported on 09/02/2014 08/28/13   Milton Ferguson, MD   BP 132/89 mmHg  Pulse 72  Temp(Src) 98.6 F (37 C) (Oral)  Resp 16  Ht 6\' 4"  (1.93 m)  Wt 260 lb (117.935 kg)  BMI 31.66 kg/m2  SpO2 100% Physical Exam  Constitutional: He appears well-developed and well-nourished. No distress.  HENT:  Head: Normocephalic and atraumatic.  Mouth/Throat: Oropharynx is clear and moist. No oropharyngeal exudate.  Eyes: Conjunctivae and EOM are normal. Pupils are equal, round, and reactive to light. Right eye exhibits no discharge. Left eye exhibits no  discharge. No scleral icterus.  Neck: Normal range of motion. Neck supple. No JVD present. No thyromegaly present.  Cardiovascular: Normal rate, regular rhythm, normal heart sounds and intact distal pulses.  Exam reveals no gallop and no friction rub.   No murmur heard. Pulmonary/Chest: Effort normal and breath sounds normal. No respiratory distress. He has no wheezes. He has no rales.  Abdominal: Soft. Bowel sounds are normal. He exhibits no distension and no mass. There is tenderness (no CVA tenderness, right upper quadrant tenderness present, no Murphy sign).  Musculoskeletal: Normal range of motion. He exhibits no edema or tenderness.  Lymphadenopathy:    He has no cervical adenopathy.  Neurological: He is alert. Coordination normal.  Skin: Skin is warm and dry. No rash noted. No erythema.  Psychiatric: He has a normal mood and affect. His behavior is normal.  Nursing note and vitals reviewed.   ED Course  Procedures (including critical care time) Labs Review Labs Reviewed  CBC WITH DIFFERENTIAL/PLATELET - Abnormal; Notable for the following:    WBC 10.7 (*)    All other components within normal limits  LIPASE, BLOOD - Abnormal; Notable for the following:    Lipase 20 (*)    All other components within normal limits  URINALYSIS, ROUTINE W REFLEX MICROSCOPIC (NOT AT [facility]) - Abnormal; Notable for the following:    Hgb urine dipstick TRACE (*)    Ketones, ur 15 (*)    All other components within normal limits  COMPREHENSIVE METABOLIC PANEL  URINE MICROSCOPIC-ADD ON    Imaging Review Ct Abdomen Pelvis W Contrast  09/02/2014   CLINICAL DATA:  Right upper quadrant abdominal pain, nausea.  EXAM: CT ABDOMEN AND PELVIS WITH CONTRAST  TECHNIQUE: Multidetector CT imaging of the abdomen and pelvis was performed using the standard protocol following bolus administration of intravenous contrast.  CONTRAST:  134mL OMNIPAQUE IOHEXOL 300 MG/ML  SOLN  COMPARISON:  None.  FINDINGS: Lower chest:  Lung bases are clear. No effusions. Heart is normal size.  Hepatobiliary: No biliary ductal dilatation or focal hepatic lesion. Gallbladder grossly unremarkable.  Pancreas: No focal abnormality or ductal dilatation.  Spleen: No focal abnormality.  Normal size.  Adrenals/Urinary Tract: No focal renal or adrenal abnormality. No hydronephrosis. Urinary bladder is unremarkable.  Stomach/Bowel: Stomach, large and small bowel grossly unremarkable. Appendix is visualized and unremarkable.  Vascular/Lymphatic: No retroperitoneal or mesenteric adenopathy. Aorta normal caliber.  Reproductive: No mass or other significant abnormality.  Other: No free fluid or free air. Small bilateral inguinal hernias containing fat.  Musculoskeletal: No focal bone lesion or acute bony abnormality.  IMPRESSION: No acute findings in the abdomen or pelvis.   Electronically Signed   By: Rolm Baptise M.D.   On: 09/02/2014 20:54      MDM   Final diagnoses:  Abdominal pain  Abdominal pain    The patient has right upper quadrant tenderness, my bedside ultrasound  does not reveal any signs of obvious gallstones, there is no hydronephrosis suggesting kidney stone, decrease conglomerate of symptoms is more suggestive of a biliary or digestive pathology, CT scan ordered, labs, nothing by mouth, pain medications.  Emergency Focused Ultrasound Exam Limited retroperitoneal ultrasound of kidneys  Performed and interpreted by Dr. Sabra Heck Indication: flank pain Focused abdominal ultrasound with both kidneys imaged in transverse and longitudinal planes in real-time. Interpretation: No  hydronephrosis visualized.   Images archived electronically  Findings d/w pt - he will return in AM for [place] - this has been ordered, he has expressed his improvement and agreement to return in AM.  Meds given in ED:  Medications  HYDROmorphone (DILAUDID) injection 1 mg (1 mg Intravenous Given 09/02/14 1949)  sodium chloride 0.9 % bolus 1,000 mL (0 mLs  Intravenous Stopped 09/02/14 2100)  ondansetron (ZOFRAN) injection 4 mg (4 mg Intravenous Given 09/02/14 1950)  iohexol (OMNIPAQUE) 300 MG/ML solution 100 mL (100 mLs Intravenous Contrast Given 09/02/14 2026)    New Prescriptions   HYDROCODONE-ACETAMINOPHEN (NORCO/VICODIN) 5-325 MG PER TABLET    Take 2 tablets by mouth every 4 (four) hours as needed for moderate pain.   PROMETHAZINE (PHENERGAN) 25 MG TABLET    Take 1 tablet (25 mg total) by mouth every 6 (six) hours as needed for nausea or vomiting.      Noemi Chapel, MD 09/02/14 2119

## 2014-09-02 NOTE — ED Notes (Signed)
Patient transported to CT 

## 2014-09-02 NOTE — ED Notes (Signed)
PT c/o RUQ abdominal pain with nausea but denies any vomiting. PT also c/o different consistency of stool with BM x1 today. PT denies any urinary symptoms.

## 2014-09-04 ENCOUNTER — Other Ambulatory Visit (HOSPITAL_COMMUNITY): Payer: Self-pay | Admitting: Emergency Medicine

## 2014-09-04 ENCOUNTER — Ambulatory Visit (HOSPITAL_COMMUNITY): Admission: RE | Admit: 2014-09-04 | Payer: BLUE CROSS/BLUE SHIELD | Source: Ambulatory Visit

## 2014-09-04 ENCOUNTER — Ambulatory Visit (HOSPITAL_COMMUNITY)
Admission: RE | Admit: 2014-09-04 | Discharge: 2014-09-04 | Disposition: A | Discharge status: Home / Self Care | Payer: BLUE CROSS/BLUE SHIELD | Source: Ambulatory Visit | Attending: Emergency Medicine | Admitting: Emergency Medicine

## 2014-09-04 DIAGNOSIS — R112 Nausea with vomiting, unspecified: Secondary | ICD-10-CM | POA: Insufficient documentation

## 2014-09-04 DIAGNOSIS — R1011 Right upper quadrant pain: Secondary | ICD-10-CM | POA: Diagnosis present

## 2014-09-10 MED FILL — Hydrocodone-Acetaminophen Tab 5-325 MG: ORAL | Qty: 6 | Status: AC

## 2016-02-18 IMAGING — CT CT ABD-PELV W/ CM
2 of 5 series · 17 of 46 positions shown, 19 images · IV contrast (Omnipaque 300)
Comparison: None.

CLINICAL DATA: Right upper quadrant abdominal pain, nausea.

EXAM:
CT ABDOMEN AND PELVIS WITH CONTRAST
TECHNIQUE: Multidetector CT imaging of the abdomen and pelvis was performed
using the standard protocol following bolus administration of
intravenous contrast.
CONTRAST:  100mL OMNIPAQUE IOHEXOL 300 MG/ML  SOLN

[Series 2: abd_pel_with 5.0 b40s · axial · 0.89mm/px · z∈[-432,-2]mm · 14 of 96 slices shown, 16 images]
[im 5/96  soft-tissue]
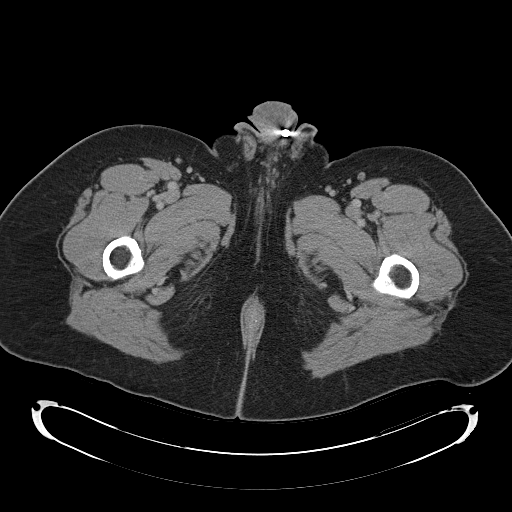
[im 5/96  bone]
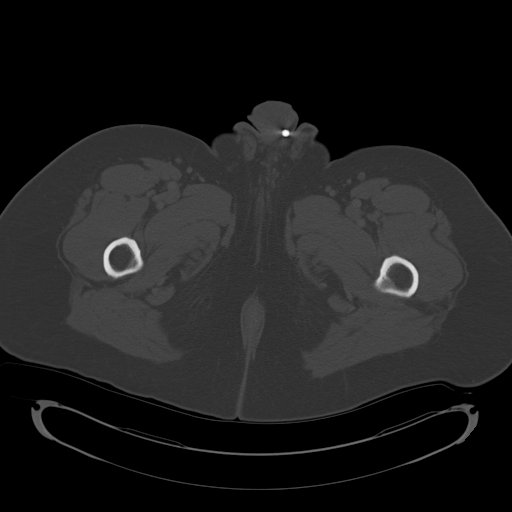
[im 14/96  soft-tissue]
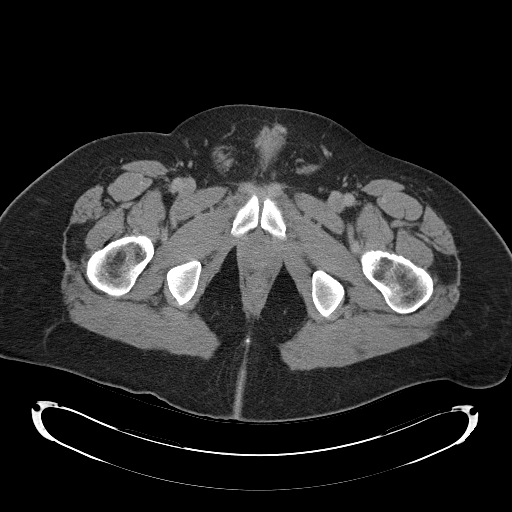
[im 19/96  soft-tissue]
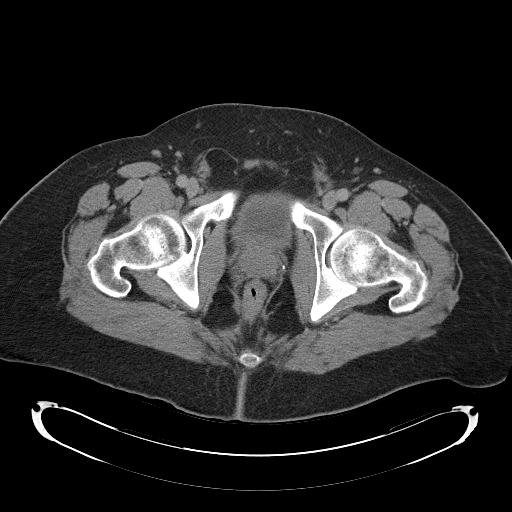
[im 28/96  soft-tissue]
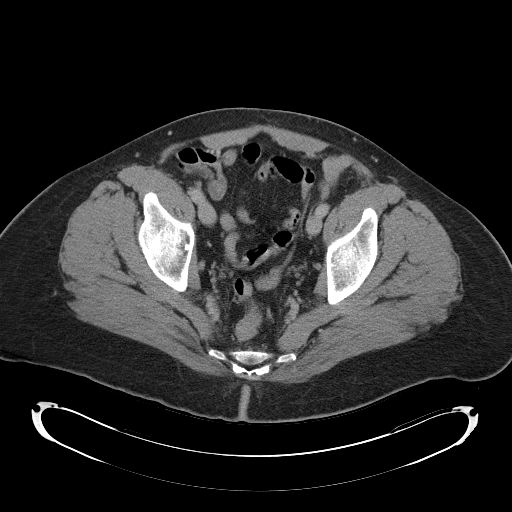
[im 32/96  soft-tissue]
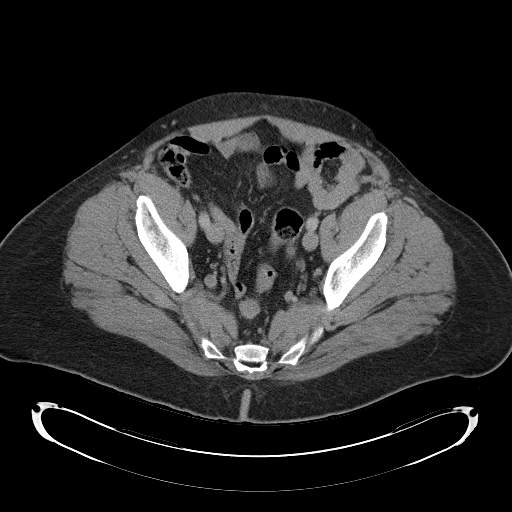
[im 37/96  soft-tissue]
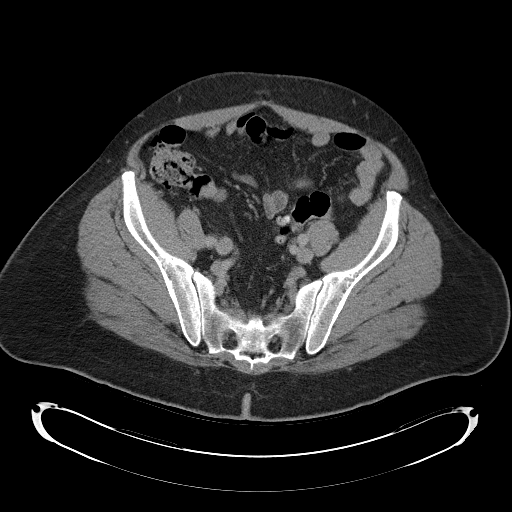
[im 46/96  soft-tissue]
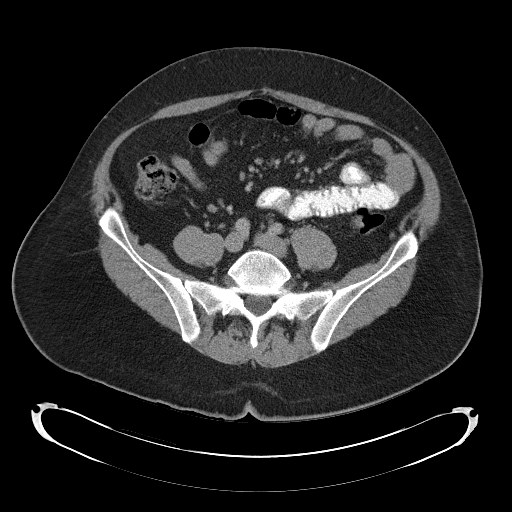
[im 50/96  soft-tissue]
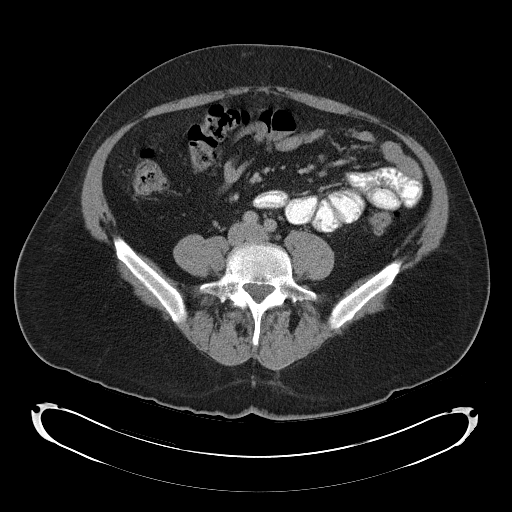
[im 59/96  soft-tissue]
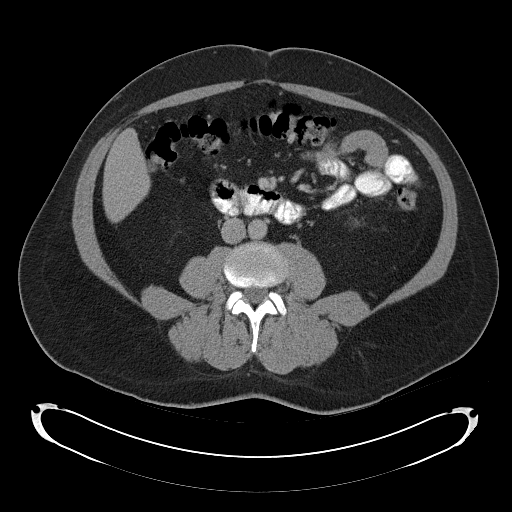
[im 59/96  bone]
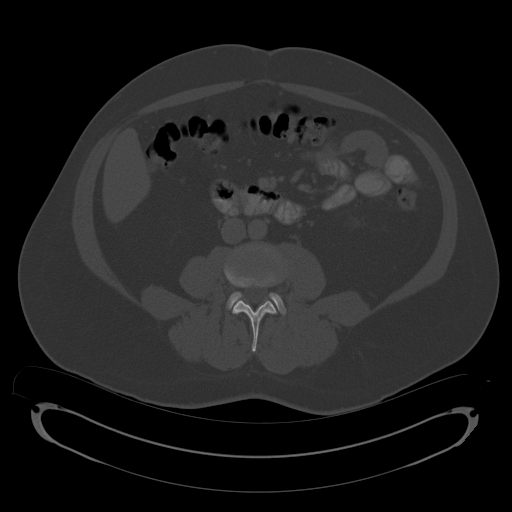
[im 64/96  soft-tissue]
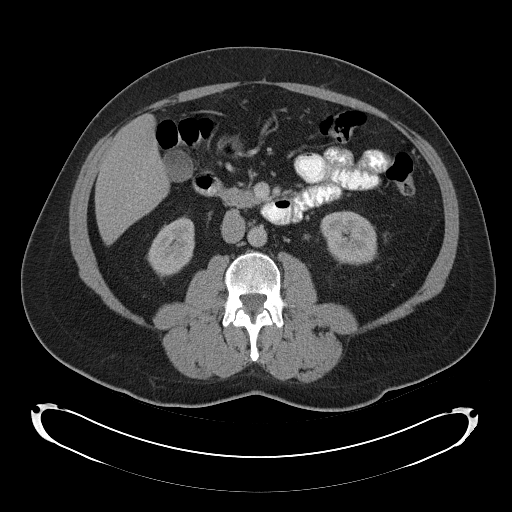
[im 73/96  soft-tissue]
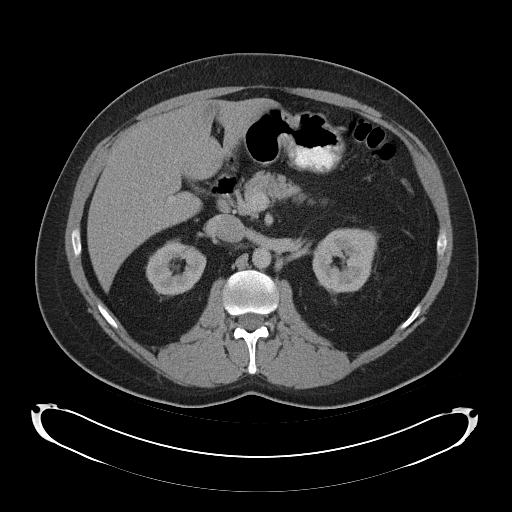
[im 77/96  soft-tissue]
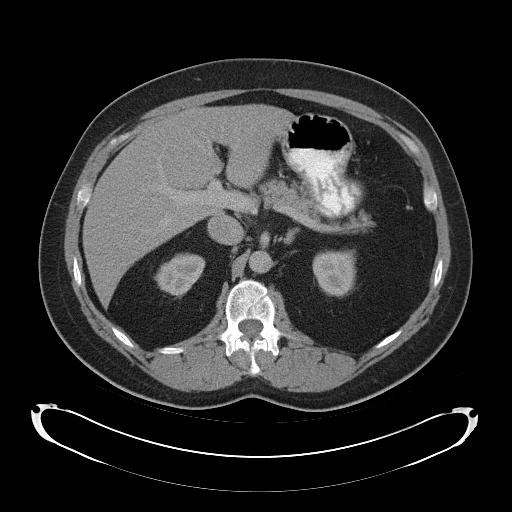
[im 82/96  soft-tissue]
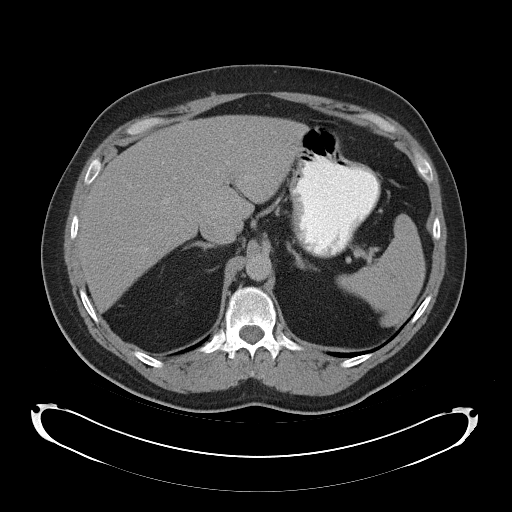
[im 91/96  soft-tissue]
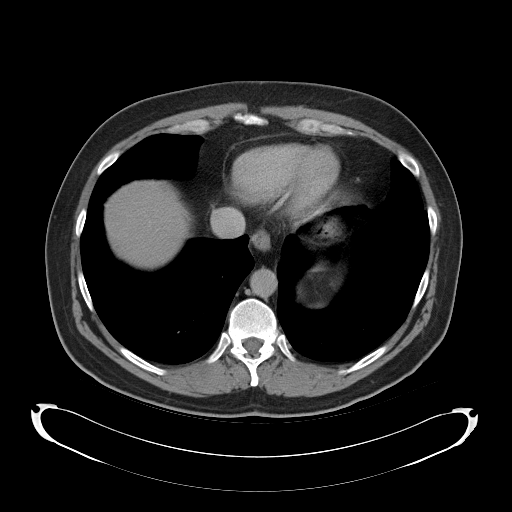

[Series 4: mpr cor post contrast 3.0mm · coronal · 0.88mm/px · 3 of 104 slices shown]
[im 35/104  soft-tissue]
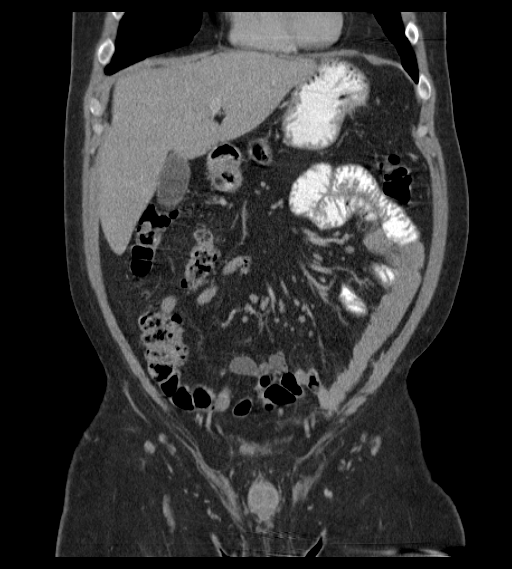
[im 46/104  soft-tissue]
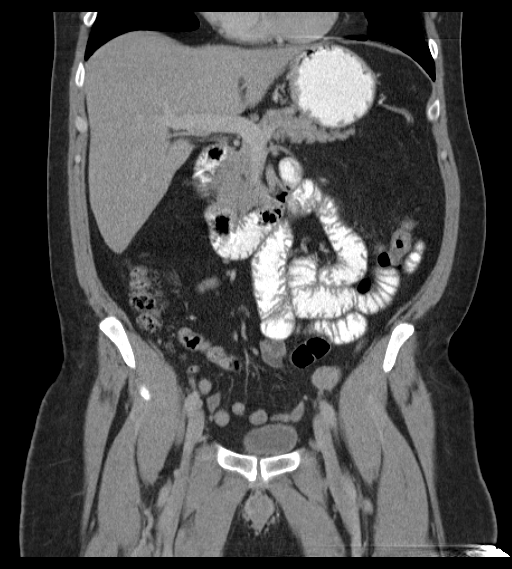
[im 58/104  soft-tissue]
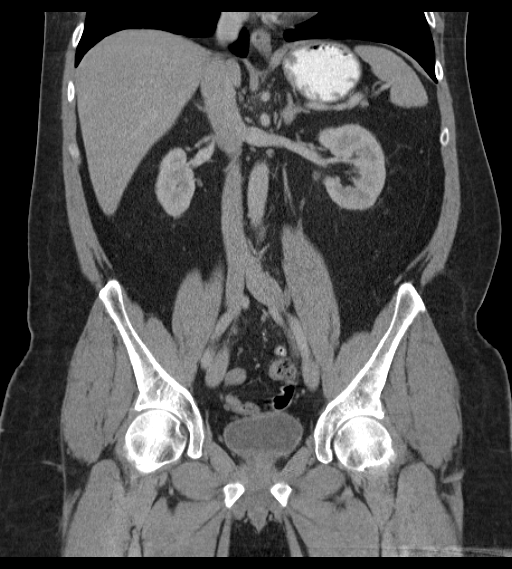

[17 of 46 positions shown; findings below may reference images not displayed]

FINDINGS: Lower chest: Lung bases are clear. No effusions. Heart is normal
size.

Hepatobiliary: No biliary ductal dilatation or focal hepatic lesion.
Gallbladder grossly unremarkable.

Pancreas: No focal abnormality or ductal dilatation.

Spleen: No focal abnormality.  Normal size.

Adrenals/Urinary Tract: No focal renal or adrenal abnormality. No
hydronephrosis. Urinary bladder is unremarkable.

Stomach/Bowel: Stomach, large and small bowel grossly unremarkable.
Appendix is visualized and unremarkable.

Vascular/Lymphatic: No retroperitoneal or mesenteric adenopathy.
Aorta normal caliber.

Reproductive: No mass or other significant abnormality.

Other: No free fluid or free air. Small bilateral inguinal hernias
containing fat.

Musculoskeletal: No focal bone lesion or acute bony abnormality.
IMPRESSION: No acute findings in the abdomen or pelvis.

## 2018-11-25 ENCOUNTER — Encounter (HOSPITAL_COMMUNITY): Payer: Self-pay | Admitting: Emergency Medicine

## 2018-11-25 ENCOUNTER — Other Ambulatory Visit: Payer: Self-pay

## 2018-11-25 ENCOUNTER — Emergency Department (HOSPITAL_COMMUNITY): Payer: BC Managed Care – PPO

## 2018-11-25 ENCOUNTER — Emergency Department (HOSPITAL_COMMUNITY)
Admission: EM | Admit: 2018-11-25 | Discharge: 2018-11-25 | Disposition: A | Discharge status: Home / Self Care | Payer: BC Managed Care – PPO | Attending: Emergency Medicine | Admitting: Emergency Medicine

## 2018-11-25 DIAGNOSIS — B349 Viral infection, unspecified: Secondary | ICD-10-CM | POA: Insufficient documentation

## 2018-11-25 DIAGNOSIS — Z20828 Contact with and (suspected) exposure to other viral communicable diseases: Secondary | ICD-10-CM | POA: Insufficient documentation

## 2018-11-25 DIAGNOSIS — I10 Essential (primary) hypertension: Secondary | ICD-10-CM | POA: Diagnosis not present

## 2018-11-25 DIAGNOSIS — R0602 Shortness of breath: Secondary | ICD-10-CM | POA: Diagnosis present

## 2018-11-25 DIAGNOSIS — Z79899 Other long term (current) drug therapy: Secondary | ICD-10-CM | POA: Insufficient documentation

## 2018-11-25 LAB — COMPREHENSIVE METABOLIC PANEL
ALT: 37 U/L (ref 0–44)
AST: 27 U/L (ref 15–41)
Albumin: 4.4 g/dL (ref 3.5–5.0)
Alkaline Phosphatase: 63 U/L (ref 38–126)
Anion gap: 10 (ref 5–15)
BUN: 20 mg/dL (ref 6–20)
CO2: 24 mmol/L (ref 22–32)
Calcium: 9.3 mg/dL (ref 8.9–10.3)
Chloride: 104 mmol/L (ref 98–111)
Creatinine, Ser: 1.2 mg/dL (ref 0.61–1.24)
GFR calc Af Amer: >60 mL/min (ref >60)
GFR calc non Af Amer: >60 mL/min (ref >60)
Glucose, Bld: 99 mg/dL (ref 70–99)
Potassium: 4.1 mmol/L (ref 3.5–5.1)
Sodium: 138 mmol/L (ref 135–145)
Total Bilirubin: 0.4 mg/dL (ref 0.3–1.2)
Total Protein: 7.4 g/dL (ref 6.5–8.1)

## 2018-11-25 LAB — CBC WITH DIFFERENTIAL/PLATELET
Abs Immature Granulocytes: 0.02 10*3/uL (ref 0.00–0.07)
Basophils Absolute: 0 10*3/uL (ref 0.0–0.1)
Basophils Relative: 0 %
Eosinophils Absolute: 0.2 10*3/uL (ref 0.0–0.5)
Eosinophils Relative: 2 %
HCT: 42.3 % (ref 39.0–52.0)
Hemoglobin: 13.6 g/dL (ref 13.0–17.0)
Immature Granulocytes: 0 %
Lymphocytes Relative: 31 %
Lymphs Abs: 2.2 10*3/uL (ref 0.7–4.0)
MCH: 28.9 pg (ref 26.0–34.0)
MCHC: 32.2 g/dL (ref 30.0–36.0)
MCV: 89.8 fL (ref 80.0–100.0)
Monocytes Absolute: 0.5 10*3/uL (ref 0.1–1.0)
Monocytes Relative: 7 %
Neutro Abs: 4.1 10*3/uL (ref 1.7–7.7)
Neutrophils Relative %: 60 %
Platelets: 317 10*3/uL (ref 150–400)
RBC: 4.71 MIL/uL (ref 4.22–5.81)
RDW: 12.5 % (ref 11.5–15.5)
WBC: 7 10*3/uL (ref 4.0–10.5)
nRBC: 0 % (ref 0.0–0.2)

## 2018-11-25 LAB — BLOOD GAS, ARTERIAL
Acid-Base Excess: 1 mmol/L (ref 0.0–2.0)
Bicarbonate: 25.3 mmol/L (ref 20.0–28.0)
FIO2: 21
O2 Saturation: 96.4 %
Patient temperature: 36.4
pCO2 arterial: 39.6 mmHg (ref 32.0–48.0)
pH, Arterial: 7.417 (ref 7.350–7.450)
pO2, Arterial: 86.5 mmHg (ref 83.0–108.0)

## 2018-11-25 LAB — TROPONIN I (HIGH SENSITIVITY)
Troponin I (High Sensitivity): <2 ng/L (ref <18)
Troponin I (High Sensitivity): <2 ng/L (ref <18)

## 2018-11-25 MED ORDER — SODIUM CHLORIDE 0.9 % IV BOLUS
1000.0000 mL | Freq: Once | INTRAVENOUS | Status: AC
  Administered 2018-11-25: 13:00:00 1000 mL via INTRAVENOUS

## 2018-11-25 NOTE — Discharge Instructions (Addendum)
Your lab tests, ekg and chest xray are reassuring today as discussed.  Your Covid test is pending and should result within the next 24-36 hours. Rest and make sure you taking care of yourself, get sleep and maintain hydration.    I recommend home quarantine until your Covid test is resulted (and is negative).   If your test is negative and your symptoms are resolved, it will be reasonable for you to return to work.        Person Under Monitoring Name: Wesley Henson  Location: [place] [address]   Infection Prevention Recommendations for Individuals Confirmed to have, or Being Evaluated for, 2019 Novel Coronavirus (COVID-19) Infection Who Receive Care at Home  Individuals who are confirmed to have, or are being evaluated for, COVID-19 should follow the prevention steps below until a healthcare provider or local or state health department says they can return to normal activities.  Stay home except to get medical care You should restrict activities outside your home, except for getting medical care. Do not go to work, school, or public areas, and do not use public transportation or taxis.  Call ahead before visiting your doctor Before your medical appointment, call the healthcare provider and tell them that you have, or are being evaluated for, COVID-19 infection. This will help the healthcare providers office take steps to keep other people from getting infected. Ask your healthcare provider to call the local or state health department.  Monitor your symptoms Seek prompt medical attention if your illness is worsening (e.g., difficulty breathing). Before going to your medical appointment, call the healthcare provider and tell them that you have, or are being evaluated for, COVID-19 infection. Ask your healthcare provider to call the local or state health department.  Wear a facemask You should wear a facemask that covers your nose and mouth when you are in the  same room with other people and when you visit a healthcare provider. People who live with or visit you should also wear a facemask while they are in the same room with you.  Separate yourself from other people in your home As much as possible, you should stay in a different room from other people in your home. Also, you should use a separate bathroom, if available.  Avoid sharing household items You should not share dishes, drinking glasses, cups, eating utensils, towels, bedding, or other items with other people in your home. After using these items, you should wash them thoroughly with soap and water.  Cover your coughs and sneezes Cover your mouth and nose with a tissue when you cough or sneeze, or you can cough or sneeze into your sleeve. Throw used tissues in a lined trash can, and immediately wash your hands with soap and water for at least 20 seconds or use an alcohol-based hand rub.  Wash your [facility] your hands often and thoroughly with soap and water for at least 20 seconds. You can use an alcohol-based hand sanitizer if soap and water are not available and if your hands are not visibly dirty. Avoid touching your eyes, nose, and mouth with unwashed hands.   Prevention Steps for Caregivers and Household Members of Individuals Confirmed to have, or Being Evaluated for, COVID-19 Infection Being Cared for in the Home  If you live with, or provide care at home for, a person confirmed to have, or being evaluated for, COVID-19 infection please follow these guidelines to prevent infection:  Follow healthcare providers instructions  Make sure that you understand and can help the patient follow any healthcare provider instructions for all care.  Provide for the patients basic needs You should help the patient with basic needs in the home and provide support for getting groceries, prescriptions, and other personal needs.  Monitor the patients symptoms If they are getting  sicker, call his or her medical provider and tell them that the patient has, or is being evaluated for, COVID-19 infection. This will help the healthcare providers office take steps to keep other people from getting infected. Ask the healthcare provider to call the local or state health department.  Limit the number of people who have contact with the patient If possible, have only one caregiver for the patient. Other household members should stay in another home or place of residence. If this is not possible, they should stay in another room, or be separated from the patient as much as possible. Use a separate bathroom, if available. Restrict visitors who do not have an essential need to be in the home.  Keep older adults, very young children, and other sick people away from the patient Keep older adults, very young children, and those who have compromised immune systems or chronic health conditions away from the patient. This includes people with chronic heart, lung, or kidney conditions, diabetes, and cancer.  Ensure good ventilation Make sure that shared spaces in the home have good air flow, such as from an air conditioner or an opened window, weather permitting.  Wash your hands often Wash your hands often and thoroughly with soap and water for at least 20 seconds. You can use an alcohol based hand sanitizer if soap and water are not available and if your hands are not visibly dirty. Avoid touching your eyes, nose, and mouth with unwashed hands. Use disposable paper towels to dry your hands. If not available, use dedicated cloth towels and replace them when they become wet.  Wear a facemask and gloves Wear a disposable facemask at all times in the room and gloves when you touch or have contact with the patients blood, body fluids, and/or secretions or excretions, such as sweat, saliva, sputum, nasal mucus, vomit, urine, or feces.  Ensure the mask fits over your nose and mouth tightly,  and do not touch it during use. Throw out disposable facemasks and gloves after using them. Do not reuse. Wash your hands immediately after removing your facemask and gloves. If your personal clothing becomes contaminated, carefully remove clothing and launder. Wash your hands after handling contaminated clothing. Place all used disposable facemasks, gloves, and other waste in a lined container before disposing them with other household waste. Remove gloves and wash your hands immediately after handling these items.  Do not share dishes, glasses, or other household items with the patient Avoid sharing household items. You should not share dishes, drinking glasses, cups, eating utensils, towels, bedding, or other items with a patient who is confirmed to have, or being evaluated for, COVID-19 infection. After the person uses these items, you should wash them thoroughly with soap and water.  Wash laundry thoroughly Immediately remove and wash clothes or bedding that have blood, body fluids, and/or secretions or excretions, such as sweat, saliva, sputum, nasal mucus, vomit, urine, or feces, on them. Wear gloves when handling laundry from the patient. Read and follow directions on labels of laundry or clothing items and detergent. In general, wash and dry with the warmest temperatures recommended on the label.  Clean all areas the  individual has used often Clean all touchable surfaces, such as counters, tabletops, doorknobs, bathroom fixtures, toilets, phones, keyboards, tablets, and bedside tables, every day. Also, clean any surfaces that may have blood, body fluids, and/or secretions or excretions on them. Wear gloves when cleaning surfaces the patient has come in contact with. Use a diluted bleach solution (e.g., dilute bleach with 1 part bleach and 10 parts water) or a household disinfectant with a label that says EPA-registered for coronaviruses. To make a bleach solution at home, add 1 tablespoon  of bleach to 1 quart (4 cups) of water. For a larger supply, add  cup of bleach to 1 gallon (16 cups) of water. Read labels of cleaning products and follow recommendations provided on product labels. Labels contain instructions for safe and effective use of the cleaning product including precautions you should take when applying the product, such as wearing gloves or eye protection and making sure you have good ventilation during use of the product. Remove gloves and wash hands immediately after cleaning.  Monitor yourself for signs and symptoms of illness Caregivers and household members are considered close contacts, should monitor their health, and will be asked to limit movement outside of the home to the extent possible. Follow the monitoring steps for close contacts listed on the symptom monitoring form.   ? If you have additional questions, contact your local health department or call the epidemiologist on call at [phone] (available 24/7). ? This guidance is subject to change. For the most up-to-date guidance from [facility], please refer to their website: [email or web address]

## 2018-11-25 NOTE — ED Provider Notes (Signed)
[facility] EMERGENCY DEPARTMENT Provider Note   CSN: [id number] Arrival date & time: 11/25/18  1102     History   Chief Complaint Chief Complaint  Patient presents with  . Shortness of Breath    HPI Wesley Henson is a 48 y.o. male with a history of Covid 14 June 2018 requiring hospitalization, felt to be recovering and has returned to work, but endorses continued fatigue along with intermittent dyspnea on exertion and difficulty with word retrieval and simply feels sluggish and fatigued, worse today.  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  His symptoms tend to worsen when under increased stress.  He denies cough, fevers, chills, n/v, chest pain, abdominal pain but reports having loose stools which started this am. He is concerned about possible reinfection with covid.  He had an exacerbation of dyspnea this am requiring him to leave work but is currently sx free. He does report similar sx in the past of sob felt to be due to panic attack.  He has had no tx prior to arrival.     The history is provided by the patient.    Past Medical History:  Diagnosis Date  . ADD (attention deficit disorder with hyperactivity)   . Diverticula of colon    complicated by abscess, since 03/2011  . Diverticulitis of large intestine with perforation 08/18/2011  . Hypertension   . Kidney calculi   . Migraine   . Penicillin allergy    History of anaphylaxis.  . Thrombocytosis ([place]) 08/23/2011  . Ureterolithiasis 08/18/2011    Patient Active Problem List   Diagnosis Date Noted  . Thrombocytosis ([place]) 08/23/2011  . Diverticulitis of large intestine with perforation 08/18/2011  . Benign hypertension 08/18/2011  . History of migraine 08/18/2011  . ADD (attention deficit disorder) 08/18/2011  . Ureterolithiasis 08/18/2011  . Hydronephrosis 08/18/2011    Past Surgical History:   Procedure Laterality Date  . COLON SURGERY    . LITHOTRIPSY    . WISDOM TOOTH EXTRACTION          Home Medications    Prior to Admission medications   Medication Sig Start Date End Date Taking? Authorizing Provider  albuterol (PROVENTIL HFA;VENTOLIN HFA) 108 (90 BASE) MCG/ACT inhaler Inhale 1-2 puffs into the lungs every 6 (six) hours as needed for wheezing or shortness of breath.   Yes [provider]  famotidine (PEPCID) 20 MG tablet Take 20 mg by mouth 2 (two) times daily.   Yes [provider]  losartan (COZAAR) 50 MG tablet Take 100 mg by mouth daily.    Yes [provider]  metoprolol succinate (TOPROL-XL) 50 MG 24 hr tablet Take 50 mg by mouth daily. Take with or immediately following a meal.   Yes [provider]  omeprazole (PRILOSEC) 20 MG capsule Take 40 mg by mouth daily.    Yes [provider]  SUMAtriptan (IMITREX) 50 MG tablet Take 50 mg by mouth every 2 (two) hours as needed for migraine or headache. May repeat in 2 hours if headache persists or recurs.   Yes [provider]  HYDROcodone-acetaminophen (NORCO/VICODIN) 5-325 MG per tablet Take 2 tablets by mouth every 4 (four) hours as needed for moderate pain. Patient not taking: Reported on 11/25/2018 09/02/14   Noemi Chapel, MD  pantoprazole (PROTONIX) 40 MG tablet Take 1 tablet (40 mg total) by mouth daily at 12  noon. 08/23/11 08/22/12  Rexene Alberts, MD  promethazine (PHENERGAN) 25 MG tablet Take 1 tablet (25 mg total) by mouth every 6 (six) hours as needed for nausea or vomiting. Patient not taking: Reported on 11/25/2018 09/02/14   Noemi Chapel, MD  traMADol (ULTRAM) 50 MG tablet Take 1 tablet (50 mg total) by mouth every 6 (six) hours as needed. Patient not taking: Reported on 09/02/2014 08/28/13   Milton Ferguson, MD    Family History Family History  Problem Relation Age of Onset  . Colon cancer Maternal Grandmother   . Diverticulitis Mother     Social History  Social History   Tobacco Use  . Smoking status: Never Smoker  Substance Use Topics  . Alcohol use: Yes    Comment: rarely  . Drug use: No     Allergies   Cephalexin, Penicillins, and Lisinopril   Review of Systems Review of Systems  Constitutional: Positive for fatigue. Negative for chills and fever.  HENT: Negative for congestion and sore throat.   Eyes: Negative.   Respiratory: Positive for chest tightness. Negative for cough, shortness of breath and wheezing.   Cardiovascular: Negative for chest pain, palpitations and leg swelling.  Gastrointestinal: Negative for abdominal pain, diarrhea, nausea and vomiting.  Genitourinary: Negative.   Musculoskeletal: Negative for arthralgias, joint swelling and neck pain.  Skin: Negative.  Negative for rash and wound.  Neurological: Negative for dizziness, weakness, light-headedness, numbness and headaches.  Psychiatric/Behavioral: Negative.      Physical Exam Updated Vital Signs BP (!) 144/86   Pulse 78   Temp 97.9 F (36.6 C) (Oral)   Resp 18   Ht 6\' 5"  (1.956 m)   Wt 122.5 kg   SpO2 100%   BMI 32.02 kg/m   Physical Exam Vitals signs and nursing note reviewed.  Constitutional:      Appearance: He is well-developed.  HENT:     Head: Normocephalic and atraumatic.  Eyes:     Extraocular Movements: Extraocular movements intact.     Conjunctiva/sclera: Conjunctivae normal.     Pupils: Pupils are equal, round, and reactive to light.  Neck:     Musculoskeletal: Normal range of motion.  Cardiovascular:     Rate and Rhythm: Normal rate and regular rhythm.     Heart sounds: Normal heart sounds.  Pulmonary:     Effort: Pulmonary effort is normal.     Breath sounds: Normal breath sounds. No decreased breath sounds, wheezing or rhonchi.  Abdominal:     General: Bowel sounds are normal.     Palpations: Abdomen is soft.     Tenderness: There is no abdominal tenderness. There is no guarding or rebound.  Musculoskeletal:  Normal range of motion.  Skin:    General: Skin is warm and dry.  Neurological:     Mental Status: He is alert.      ED Treatments / Results  Labs (all labs ordered are listed, but only abnormal results are displayed) Labs Reviewed  BLOOD GAS, ARTERIAL - Abnormal; Notable for the following components:      Result Value   Allens test (pass/fail) BRACHIAL ARTERY (*)    All other components within normal limits  SARS CORONAVIRUS 2 (TAT 6-12 HRS)  CBC WITH DIFFERENTIAL/PLATELET  COMPREHENSIVE METABOLIC PANEL  TROPONIN I (HIGH SENSITIVITY)  TROPONIN I (HIGH SENSITIVITY)    EKG EKG Interpretation  Date/Time:  Sunday November 25 2018 11:30:59 EDT Ventricular Rate:  77 PR Interval:    QRS Duration: 98 QT Interval:  363 QTC Calculation: 411 R Axis:   25 Text Interpretation:  Sinus rhythm Abnormal R-wave progression, early transition Baseline wander When compared with ECG of 08/03/2011 No significant change was found Confirmed by Francine Graven [phone]) on 11/25/2018 12:15:19 PM   Radiology Dg Chest Port 1 View  Result Date: 11/25/2018 CLINICAL DATA:  Dyspnea EXAM: PORTABLE CHEST 1 VIEW COMPARISON:  01/15/2015 chest radiograph. FINDINGS: Stable cardiomediastinal silhouette with normal heart size. No pneumothorax. No pleural effusion. Lungs appear clear, with no acute consolidative airspace disease and no pulmonary edema. IMPRESSION: No active disease. Electronically Signed   By: Ilona Sorrel M.D.   On: 11/25/2018 13:00    Procedures Procedures (including critical care time)  Medications Ordered in ED Medications  sodium chloride 0.9 % bolus 1,000 mL (0 mLs Intravenous Stopped 11/25/18 1605)     Initial Impression / Assessment and Plan / ED Course  I have reviewed the triage vital signs and the nursing notes.  Pertinent labs & imaging results that were available during my care of the patient were reviewed by me and considered in my medical decision making (see chart for  details).        Pt's labs and cxr reviewed and interpreted with normal findings.  He is not hypoxic with a normal abg.  Ekg, delta troponins, cxr clear.  Pt has been sx free here. 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  outpt covid test completed.  Discussed rest, home restriction until his Covid test is negative.   The patient appears reasonably screened and/or stabilized for discharge and I doubt any other medical condition or other [facility] requiring further screening, evaluation, or treatment in the ED at this time prior to discharge.  Wesley Henson was evaluated in Emergency Department on 11/27/2018 for the symptoms described in the history of present illness. He was evaluated in the context of the global COVID-19 pandemic, which necessitated consideration that the patient might be at risk for infection with the SARS-CoV-2 virus that causes COVID-19. Institutional protocols and algorithms that pertain to the evaluation of patients at risk for COVID-19 are in a state of rapid change based on information released by regulatory bodies including the CDC and federal and state organizations. These policies and algorithms were followed during the patient's care in the ED.   Final Clinical Impressions(s) / ED Diagnoses   Final diagnoses:  SOB (shortness of breath)  Viral syndrome    ED Discharge Orders    None       Landis Martins 11/27/18 [place], [place], DO 11/29/18 1544

## 2018-11-25 NOTE — ED Triage Notes (Addendum)
Patient complains of dypsnea on exertion with lethargy and brain fog.

## 2018-11-26 LAB — SARS CORONAVIRUS 2 (TAT 6-24 HRS): SARS Coronavirus 2: NEGATIVE

## 2020-07-01 ENCOUNTER — Encounter: Payer: Self-pay | Admitting: Gastroenterology

## 2020-07-21 ENCOUNTER — Ambulatory Visit: Payer: BC Managed Care – PPO | Admitting: Gastroenterology

## 2020-07-21 ENCOUNTER — Encounter: Payer: Self-pay | Admitting: Gastroenterology

## 2020-07-21 VITALS — BP 118/82 | HR 82 | Ht 76.0 in | Wt 274.5 lb

## 2020-07-21 DIAGNOSIS — K219 Gastro-esophageal reflux disease without esophagitis: Secondary | ICD-10-CM | POA: Diagnosis not present

## 2020-07-21 DIAGNOSIS — R1310 Dysphagia, unspecified: Secondary | ICD-10-CM | POA: Diagnosis not present

## 2020-07-21 DIAGNOSIS — R194 Change in bowel habit: Secondary | ICD-10-CM

## 2020-07-21 MED ORDER — CLENPIQ 10-3.5-12 MG-GM -GM/160ML PO SOLN: 320.0000 mL | Freq: Once | ORAL | 0 refills | Status: AC

## 2020-07-21 MED ORDER — DEXLANSOPRAZOLE 60 MG PO CPDR: 60.0000 mg | DELAYED_RELEASE_CAPSULE | Freq: Every day | ORAL | 5 refills | Status: DC

## 2020-07-21 NOTE — Progress Notes (Signed)
07/21/2020 Wesley Henson [id number] [birth year]   HISTORY OF PRESENT ILLNESS: This is a 50 year old male who is new to our practice.  He has past medical history of hypertension, kidney stones, refractory diverticulitis in 5093 that was complicated by abscess and required extensive IV antibiotics.  Eventually he underwent laparoscopic assisted sigmoid colectomy at [facility] by Dr. Morton Stall in June 2013.  He says that course was uneventful.  He then had a post op colonoscopy about 6 months later that he said was unremarkable and everything looked well.  He did still have some other diverticula noted according to his report.  I do not have the actual colonoscopy reports for review.  Nonetheless, he has not had a colonoscopy since that time.  He says that he was feeling well up until March when he noticed a change in his bowel habits.  He says that his stools have changed in consistency and tend to be much softer/looser/runny year.  When they are like that there is a lot more urgency with the stools as well.  He admits to seeing bright red blood on the toilet paper, etc. which he relates to hemorrhoids; nothing that prompted him to seek evaluation for.  He typically has about 2 bowel movements a day.  He tells me that he was one of the first documented cases of COVID-19 in the [place] area.  He was hospitalized for that and admits to some long-term sequela including reduced lung capacities, etc.  He says that he had some diarrhea with the COVID-19 and wonders if it had any impact on his GI system long-term.  While he is here he also admits to acid reflux.  He says that he would say that it is moderately controlled on omeprazole 40 mg daily.  He has been on omeprazole for probably 20 years or more.  He had an EGD in his 71s for acid reflux issues.  He said that his PPI was switched to pantoprazole at some point but that did not seem to allow any more improvement in his symptoms.  He notices dysphagia mostly  just to pills, no issues with swallowing solid food.  He reports a constant globus sensation.  He reports epigastric burning that he was concerned about a gastric ulcer.  He says it improves with eating.  He has used Carafate intermittently to help with that and it does seem to improve his symptoms.  He is also asking about H. Pylori.  He had recent laboratory work-up by his PCP.  CBC and CMP were within normal limits.  CRP was elevated at 5.3.  TSH is normal.  He had stool culture, C. difficile, ova and parasites that were all negative.  Stools for occult blood were negative.  He is a Therapist, art for a plasma center.  He was referred here by his PCP, Dr. Margo Aye, for evaluation regarding worsening acid reflux and to consider colonoscopy.   Past Medical History:  Diagnosis Date  . ADD (attention deficit disorder with hyperactivity)   . Diverticula of colon    complicated by abscess, since 03/2011  . Diverticulitis of large intestine with perforation 08/18/2011  . Hypertension   . Kidney calculi   . Migraine   . Penicillin allergy    History of anaphylaxis.  . Thrombocytosis 08/23/2011  . Ureterolithiasis 08/18/2011   Past Surgical History:  Procedure Laterality Date  . COLON SURGERY    . COLONOSCOPY    . LITHOTRIPSY    . WISDOM  TOOTH EXTRACTION      reports that he has never smoked. He has never used smokeless tobacco. He reports current alcohol use. He reports that he does not use drugs. family history includes Colon cancer in his paternal grandmother; Diabetes in his father; Diverticulitis in his mother; Esophageal cancer in his maternal grandfather; Kidney disease in his father. Allergies  Allergen Reactions  . Cephalexin Hives  . Penicillins Anaphylaxis    Did it involve swelling of the face/tongue/throat, SOB, or low BP? No Did it involve sudden or severe rash/hives, skin peeling, or any reaction on the inside of your mouth or nose? No Did you need to seek medical  attention at a hospital or doctor's office? No When did it last happen? If all above answers are "NO", may proceed with cephalosporin use.   . Sulfa Antibiotics     Bactrim: Fixed drug eruption, Skin necrosis  . Lisinopril Cough      Outpatient Encounter Medications as of 07/21/2020  Medication Sig  . albuterol (PROVENTIL HFA;VENTOLIN HFA) 108 (90 BASE) MCG/ACT inhaler Inhale 1-2 puffs into the lungs every 6 (six) hours as needed for wheezing or shortness of breath.  Marland Kitchen HYDROcodone-acetaminophen (NORCO/VICODIN) 5-325 MG per tablet Take 2 tablets by mouth every 4 (four) hours as needed for moderate pain.  Marland Kitchen losartan (COZAAR) 50 MG tablet Take 100 mg by mouth daily.   . methocarbamol (ROBAXIN) 750 MG tablet Take 1 tablet by mouth 2 (two) times daily as needed.  . methylPREDNISolone (MEDROL DOSEPAK) 4 MG TBPK tablet Take by mouth as directed.  . metoprolol succinate (TOPROL-XL) 50 MG 24 hr tablet Take 50 mg by mouth daily. Take with or immediately following a meal.  . omeprazole (PRILOSEC) 40 MG capsule Take 40 mg by mouth daily.   . SUMAtriptan (IMITREX) 50 MG tablet Take 50 mg by mouth every 2 (two) hours as needed for migraine or headache. May repeat in 2 hours if headache persists or recurs.  . tadalafil (CIALIS) 5 MG tablet Take 5 mg by mouth daily.  . [DISCONTINUED] famotidine (PEPCID) 20 MG tablet Take 20 mg by mouth 2 (two) times daily. (Patient not taking: Reported on 07/21/2020)  . [DISCONTINUED] pantoprazole (PROTONIX) 40 MG tablet Take 1 tablet (40 mg total) by mouth daily at 12 noon.  . [DISCONTINUED] promethazine (PHENERGAN) 25 MG tablet Take 1 tablet (25 mg total) by mouth every 6 (six) hours as needed for nausea or vomiting. (Patient not taking: No sig reported)  . [DISCONTINUED] traMADol (ULTRAM) 50 MG tablet Take 1 tablet (50 mg total) by mouth every 6 (six) hours as needed. (Patient not taking: No sig reported)   No facility-administered encounter medications on file as  of 07/21/2020.     REVIEW OF SYSTEMS  : All other systems reviewed and negative except where noted in the History of Present Illness.   PHYSICAL EXAM: BP 118/82 (BP Location: Left Arm, Patient Position: Sitting, Cuff Size: Large)   Pulse 82   Ht 6\' 4"  (1.93 m)   Wt 274 lb 8 oz (124.5 kg)   BMI 33.41 kg/m  General: Well developed white male in no acute distress Head: Normocephalic and atraumatic Eyes:  Sclerae anicteric, conjunctiva pink. Ears: Normal auditory acuity Lungs: Clear throughout to auscultation; no W/R/R. Heart: Regular rate and rhythm; no M/R/G. Abdomen: Soft, non-distended.  BS present.  Non-tender. Rectal:  Will be done at the time of colonoscopy. Musculoskeletal: Symmetrical with no gross deformities  Skin: No lesions on visible extremities  Extremities: No edema  Neurological: Alert oriented x 4, grossly non-focal Psychological:  Alert and cooperative. Normal mood and affect  ASSESSMENT AND PLAN: *Change in bowel habits: Has noticed a can change in the consistency of his stool over the past 1 to 2 months.  When the stools are softer or looser there is more urgency.  Last colonoscopy was in 2013, about 6 months after his sigmoidectomy for diverticulitis.  Stool studies are negative, TSH is normal.  High sensitivity CRP is slightly elevated at 5.3.  Will plan for colonoscopy with Dr. Bryan Lemma.   *Longstanding GERD with intermittent dysphagia to pills: Had an EGD in his 31s.  Feels like his GERD is moderately controlled, but gets intermittent epigastric burning.  He is asking about a gastric ulcer and H. pylori.  He is on omeprazole 40 mg daily.  Pantoprazole did not seem to be any more effective in the past.  Uses Carafate on a rare occasion as needed.  Will discontinue omeprazole and try Dexilant 60 mg daily instead if it is covered by his insurance.  Prescription sent to pharmacy.  We will plan for EGD with Dr. Bryan Lemma as well.  **The risks, benefits, and  alternatives to EGD and colonsocopy were discussed with the patient and he consents to proceed.    CC:  [place], Jenetta Downer,*

## 2020-07-21 NOTE — Patient Instructions (Signed)
If you are age 52 or older, your body mass index should be between 23-30. Your Body mass index is 33.41 kg/m. If this is out of the aforementioned range listed, please consider follow up with your Primary Care Provider.  If you are age 72 or younger, your body mass index should be between 19-25. Your Body mass index is 33.41 kg/m. If this is out of the aformentioned range listed, please consider follow up with your Primary Care Provider.   You have been scheduled for an endoscopy and colonoscopy. Please follow the written instructions given to you at your visit today. Please pick up your prep supplies at the pharmacy within the next 1-3 days. If you use inhalers (even only as needed), please bring them with you on the day of your procedure.  We have sent the following medications to your pharmacy for you to pick up at your convenience: Dexilant 60 mg .  Thank you for choosing me and [facility].  Alonza Bogus, PA-C

## 2020-08-06 NOTE — Progress Notes (Signed)
Agree with the assessment and plan as outlined by Jessica Zehr, PA-C. ? ?Camy Leder, DO, FACG ? ?

## 2020-08-13 ENCOUNTER — Telehealth: Payer: Self-pay | Admitting: Gastroenterology

## 2020-08-13 NOTE — Telephone Encounter (Signed)
Inbound call from patient. States need prior authorization for insurance for [employer].

## 2020-08-27 ENCOUNTER — Telehealth: Payer: Self-pay

## 2020-08-27 NOTE — Telephone Encounter (Signed)
Called Wesley Henson to remind him of his appt next Wednesday 09/02/20 with Dr. Bryan Lemma. Wesley Henson stated that he misplaced his prep instructions and is requesting new instructions via mycart. Please advise.

## 2020-08-27 NOTE — Telephone Encounter (Signed)
Patient instructions sent to patient by MyChart.  Phone call to patient to make him aware that instructions were sent to him.  He was advised to contact our office if he does not receive instructions.  Patient agreed to plan and verbalized understanding.  No further questions.

## 2020-08-28 ENCOUNTER — Encounter: Payer: Self-pay | Admitting: Gastroenterology

## 2020-09-02 ENCOUNTER — Encounter: Payer: BC Managed Care – PPO | Admitting: Gastroenterology

## 2020-09-04 ENCOUNTER — Encounter: Payer: BC Managed Care – PPO | Admitting: Gastroenterology

## 2020-10-06 ENCOUNTER — Other Ambulatory Visit: Payer: Self-pay

## 2020-10-06 ENCOUNTER — Other Ambulatory Visit: Payer: BC Managed Care – PPO

## 2020-10-06 ENCOUNTER — Other Ambulatory Visit: Payer: Self-pay | Admitting: Gastroenterology

## 2020-10-06 ENCOUNTER — Ambulatory Visit (AMBULATORY_SURGERY_CENTER): Payer: BC Managed Care – PPO | Admitting: Gastroenterology

## 2020-10-06 ENCOUNTER — Encounter: Payer: Self-pay | Admitting: Gastroenterology

## 2020-10-06 VITALS — BP 132/89 | HR 73 | Temp 97.8°F | Resp 14 | Ht 76.0 in | Wt 274.0 lb

## 2020-10-06 DIAGNOSIS — K5730 Diverticulosis of large intestine without perforation or abscess without bleeding: Secondary | ICD-10-CM | POA: Diagnosis not present

## 2020-10-06 DIAGNOSIS — K5289 Other specified noninfective gastroenteritis and colitis: Secondary | ICD-10-CM | POA: Diagnosis not present

## 2020-10-06 DIAGNOSIS — R194 Change in bowel habit: Secondary | ICD-10-CM

## 2020-10-06 DIAGNOSIS — R197 Diarrhea, unspecified: Secondary | ICD-10-CM | POA: Diagnosis not present

## 2020-10-06 DIAGNOSIS — K529 Noninfective gastroenteritis and colitis, unspecified: Secondary | ICD-10-CM

## 2020-10-06 MED ORDER — SODIUM CHLORIDE 0.9 % IV SOLN: 500.0000 mL | INTRAVENOUS | Status: DC

## 2020-10-06 NOTE — Addendum Note (Signed)
Addended by: Octavio Manns E on: 10/06/2020 04:32 PM   Modules accepted: Orders

## 2020-10-06 NOTE — Progress Notes (Signed)
Vs CW ° °

## 2020-10-06 NOTE — Progress Notes (Signed)
Met with patient in the preoperative area.  He has significant front tooth decay, with a cracked front tooth.  He is planning on upcoming dental extraction.  We discussed the possibility for a tooth to get knocked loose or further cracked during upper endoscopy with a bite-block in place, which could present an airway issue.  Based on his symptoms, I think it is more prudent to proceed with dental procedures prior to upper endoscopy to reduce that risk.  In the meantime, will send to the lab for celiac serologies.  Can proceed with colonoscopy today as planned.  To continue with reflux management as previously prescribed until able to perform upper endoscopy.  Ok to resume Carafate in the interim, as this provides relief of some of his UGI sxs (abdominal pain).   We discussed this at length, and he agrees with this plan.

## 2020-10-06 NOTE — Op Note (Signed)
[place] Patient Name: Wesley Henson Procedure Date: 10/06/2020 2:28 PM MRN: [id number] Endoscopist: Gerrit Heck , MD Age: 50 Referring MD:  Date of Birth: [birth year] Gender: Male Account #: [id number] Procedure:                Colonoscopy Indications:              Change in bowel habits, Diarrhea, Fecal urgency                           History of complex, recurrent diverticulitis, with                            segmental sigmoid resection in 2013. Repeat                            colonoscopy 6 months later in 2013 with some                            diverticulosis, but otherwise normal and no polyps. Medicines:                Monitored Anesthesia Care Procedure:                Pre-Anesthesia Assessment:                           - Prior to the procedure, a History and Physical                            was performed, and patient medications and                            allergies were reviewed. The patient's tolerance of                            previous anesthesia was also reviewed. The risks                            and benefits of the procedure and the sedation                            options and risks were discussed with the patient.                            All questions were answered, and informed consent                            was obtained. Prior Anticoagulants: The patient has                            taken no previous anticoagulant or antiplatelet                            agents. ASA Grade Assessment: II - A patient with  mild systemic disease. After reviewing the risks                            and benefits, the patient was deemed in                            satisfactory condition to undergo the procedure.                           After obtaining informed consent, the colonoscope                            was passed under direct vision. Throughout the                            procedure, the patient's  blood pressure, pulse, and                            oxygen saturations were monitored continuously. The                            CF-HQ190L was introduced through the anus and                            advanced to the the terminal ileum. The colonoscopy                            was performed without difficulty. The patient                            tolerated the procedure well. The quality of the                            bowel preparation was good. The terminal ileum,                            ileocecal valve, appendiceal orifice, and rectum                            were photographed. Scope In: 2:46:29 PM Scope Out: 2:59:48 PM Scope Withdrawal Time: 0 hours 11 minutes 39 seconds  Total Procedure Duration: 0 hours 13 minutes 19 seconds  Findings:                 The perianal and digital rectal examinations were                            normal.                           Multiple small and large-mouthed diverticula were                            found in the sigmoid colon, descending colon,  transverse colon and ascending colon.                           There was evidence of a prior end-to-end                            colo-colonic anastomosis in the distal sigmoid                            colon. This was patent and was characterized by                            healthy appearing mucosa. The anastomosis was                            traversed.                           A small area of polypoid mucosa located just distal                            to the anastamosis. Several mucosal biopsies were                            taken with a cold forceps for histology. Estimated                            blood loss was minimal.                           The remainder of the colonic mucosa was otherwise                            normal appearing throughout. Biopsies for histology                            were taken with a cold forceps from the  right colon                            and left colon for evaluation of microscopic                            colitis. Estimated blood loss was minimal.                           The retroflexed view of the distal rectum and anal                            verge was normal and showed no anal or rectal                            abnormalities.                           The  terminal ileum appeared normal. Complications:            No immediate complications. Estimated Blood Loss:     Estimated blood loss was minimal. Impression:               - Diverticulosis in the sigmoid colon, in the                            descending colon, in the transverse colon and in                            the ascending colon.                           - Patent end-to-end colo-colonic anastomosis,                            characterized by healthy appearing mucosa.                           - Nodular mucosa in the distal sigmoid colon.                            Biopsied.                           - The remainder of the colonic mucosa was otherwise                            normal appearing. Biopsied.                           - The distal rectum and anal verge are normal on                            retroflexion view.                           - The examined portion of the ileum was normal. Recommendation:           - Patient has a contact number available for                            emergencies. The signs and symptoms of potential                            delayed complications were discussed with the                            patient. Return to normal activities tomorrow.                            Written discharge instructions were provided to the                            patient.                           -  Resume previous diet.                           - Continue present medications.                           - Await pathology results.                           - Plan for Upper Endoscopy  following dental                            procedures. Gerrit Heck, MD 10/06/2020 3:07:26 PM

## 2020-10-06 NOTE — Progress Notes (Signed)
Called to room to assist during endoscopic procedure.  Patient ID and intended procedure confirmed with present staff. Received instructions for my participation in the procedure from the performing physician.  

## 2020-10-06 NOTE — Patient Instructions (Signed)
YOU HAD AN ENDOSCOPIC PROCEDURE TODAY AT THE [facility]:   Refer to the procedure report that was given to you for any specific questions about what was found during the examination.  If the procedure report does not answer your questions, please call your gastroenterologist to clarify.  If you requested that your care partner not be given the details of your procedure findings, then the procedure report has been included in a sealed envelope for you to review at your convenience later.  YOU SHOULD EXPECT: Some feelings of bloating in the abdomen. Passage of more gas than usual.  Walking can help get rid of the air that was put into your GI tract during the procedure and reduce the bloating. If you had a lower endoscopy (such as a colonoscopy or flexible sigmoidoscopy) you may notice spotting of blood in your stool or on the toilet paper. If you underwent a bowel prep for your procedure, you may not have a normal bowel movement for a few days.  Please Note:  You might notice some irritation and congestion in your nose or some drainage.  This is from the oxygen used during your procedure.  There is no need for concern and it should clear up in a day or so.  SYMPTOMS TO REPORT IMMEDIATELY:   Following lower endoscopy (colonoscopy or flexible sigmoidoscopy):  Excessive amounts of blood in the stool  Significant tenderness or worsening of abdominal pains  Swelling of the abdomen that is new, acute  Fever of 100F or higher   Following upper endoscopy (EGD)  Vomiting of blood or coffee ground material  New chest pain or pain under the shoulder blades  Painful or persistently difficult swallowing  New shortness of breath  Fever of 100F or higher  Black, tarry-looking stools  For urgent or emergent issues, a gastroenterologist can be reached at any hour by calling [phone]. Do not use MyChart messaging for urgent concerns.    DIET:  We do recommend a small meal at first, but  then you may proceed to your regular diet.  Drink plenty of fluids but you should avoid alcoholic beverages for 24 hours.  ACTIVITY:  You should plan to take it easy for the rest of today and you should NOT DRIVE or use heavy machinery until tomorrow (because of the sedation medicines used during the test).    FOLLOW UP: Our staff will call the number listed on your records 48-72 hours following your procedure to check on you and address any questions or concerns that you may have regarding the information given to you following your procedure. If we do not reach you, we will leave a message.  We will attempt to reach you two times.  During this call, we will ask if you have developed any symptoms of COVID 19. If you develop any symptoms (ie: fever, flu-like symptoms, shortness of breath, cough etc.) before then, please call [phone].  If you test positive for Covid 19 in the 2 weeks post procedure, please call and report this information to us.    If any biopsies were taken you will be contacted by phone or by letter within the next 1-3 weeks.  Please call us at [phone] if you have not heard about the biopsies in 3 weeks.    SIGNATURES/CONFIDENTIALITY: You and/or your care partner have signed paperwork which will be entered into your electronic medical record.  These signatures attest to the fact that that the information above on   your After Visit Summary has been reviewed and is understood.  Full responsibility of the confidentiality of this discharge information lies with you and/or your care-partner. 

## 2020-10-06 NOTE — Addendum Note (Signed)
Addended by: Octavio Manns E on: 10/06/2020 04:30 PM   Modules accepted: Orders

## 2020-10-06 NOTE — Progress Notes (Signed)
Report given to PACU, vss 

## 2020-10-07 LAB — IGA: Immunoglobulin A: 197 mg/dL (ref 47–310)

## 2020-10-07 LAB — TISSUE TRANSGLUTAMINASE ABS,IGG,IGA
(tTG) Ab, IgA: <1 U/mL
(tTG) Ab, IgG: <1 U/mL

## 2020-10-08 ENCOUNTER — Telehealth: Payer: Self-pay

## 2020-10-08 NOTE — Telephone Encounter (Signed)
  Follow up Call-  Call back number 10/06/2020  Post procedure Call Back phone  # [phone]  Permission to leave phone message Yes  Some recent data might be hidden     Patient questions:  Do you have a fever, pain , or abdominal swelling? No. Pain Score  0 *  Have you tolerated food without any problems? Yes.    Have you been able to return to your normal activities? Yes.    Do you have any questions about your discharge instructions: Diet   No. Medications  No. Follow up visit  No.  Do you have questions or concerns about your Care? No.  Actions: * If pain score is 4 or above: No action needed, pain <4.  Have you developed a fever since your procedure? no  2.   Have you had an respiratory symptoms (SOB or cough) since your procedure? no  3.   Have you tested positive for COVID 19 since your procedure no  4.   Have you had any family members/close contacts diagnosed with the COVID 19 since your procedure?  no   If yes to any of these questions please route to Joylene John, RN and Joella Prince, RN

## 2021-02-26 ENCOUNTER — Other Ambulatory Visit: Payer: Self-pay | Admitting: Gastroenterology

## 2021-03-23 ENCOUNTER — Other Ambulatory Visit: Payer: Self-pay | Admitting: Gastroenterology

## 2021-05-26 ENCOUNTER — Other Ambulatory Visit: Payer: Self-pay | Admitting: Gastroenterology

## 2021-07-29 ENCOUNTER — Other Ambulatory Visit: Payer: Self-pay | Admitting: Gastroenterology

## 2021-08-25 ENCOUNTER — Telehealth: Payer: Self-pay | Admitting: Pharmacy Technician

## 2021-08-25 ENCOUNTER — Other Ambulatory Visit (HOSPITAL_COMMUNITY): Payer: Self-pay

## 2021-08-25 NOTE — Telephone Encounter (Signed)
Patient Advocate Encounter  Received notification from [facility] that prior authorization for DEXLANSOPRAZOLE 60 is required.   PA submitted on 5.31.23 Key [id number] Status is pending   [facility] will continue to follow  Luciano Cutter, CPhT Patient Advocate Phone: [phone]

## 2021-09-15 ENCOUNTER — Other Ambulatory Visit: Payer: Self-pay | Admitting: Orthopedic Surgery

## 2021-09-16 ENCOUNTER — Ambulatory Visit (HOSPITAL_BASED_OUTPATIENT_CLINIC_OR_DEPARTMENT_OTHER)
Admission: RE | Admit: 2021-09-16 | Discharge: 2021-09-16 | Disposition: A | Discharge status: Home / Self Care | Payer: BC Managed Care – PPO | Attending: Orthopedic Surgery | Admitting: Orthopedic Surgery

## 2021-09-16 ENCOUNTER — Other Ambulatory Visit: Payer: Self-pay

## 2021-09-16 ENCOUNTER — Ambulatory Visit (HOSPITAL_BASED_OUTPATIENT_CLINIC_OR_DEPARTMENT_OTHER): Payer: BC Managed Care – PPO | Admitting: Anesthesiology

## 2021-09-16 ENCOUNTER — Encounter (HOSPITAL_BASED_OUTPATIENT_CLINIC_OR_DEPARTMENT_OTHER): Payer: Self-pay | Admitting: Orthopedic Surgery

## 2021-09-16 ENCOUNTER — Encounter (HOSPITAL_BASED_OUTPATIENT_CLINIC_OR_DEPARTMENT_OTHER)
Admission: RE | Disposition: A | Discharge status: Home / Self Care | Payer: Self-pay | Source: Home / Self Care | Attending: Orthopedic Surgery

## 2021-09-16 DIAGNOSIS — W260XXA Contact with knife, initial encounter: Secondary | ICD-10-CM | POA: Diagnosis not present

## 2021-09-16 DIAGNOSIS — S61211A Laceration without foreign body of left index finger without damage to nail, initial encounter: Secondary | ICD-10-CM | POA: Insufficient documentation

## 2021-09-16 DIAGNOSIS — N289 Disorder of kidney and ureter, unspecified: Secondary | ICD-10-CM | POA: Insufficient documentation

## 2021-09-16 DIAGNOSIS — K219 Gastro-esophageal reflux disease without esophagitis: Secondary | ICD-10-CM | POA: Insufficient documentation

## 2021-09-16 DIAGNOSIS — R519 Headache, unspecified: Secondary | ICD-10-CM | POA: Diagnosis not present

## 2021-09-16 DIAGNOSIS — I10 Essential (primary) hypertension: Secondary | ICD-10-CM | POA: Diagnosis not present

## 2021-09-16 HISTORY — PX: NERVE, TENDON AND ARTERY REPAIR: SHX5695

## 2021-09-16 SURGERY — NERVE, TENDON AND ARTERY REPAIR
Anesthesia: Regional | Site: Hand | Laterality: Left

## 2021-09-16 MED ORDER — MIDAZOLAM HCL 2 MG/2ML IJ SOLN
2.0000 mg | Freq: Once | INTRAMUSCULAR | Status: AC
  Administered 2021-09-16: 2 mg via INTRAVENOUS

## 2021-09-16 MED ORDER — VANCOMYCIN HCL IN DEXTROSE 1-5 GM/200ML-% IV SOLN
INTRAVENOUS | Status: AC
Start: 2021-09-16 — End: ?
  Filled 2021-09-16: qty 200

## 2021-09-16 MED ORDER — DEXAMETHASONE SODIUM PHOSPHATE 10 MG/ML IJ SOLN
INTRAMUSCULAR | Status: AC
  Filled 2021-09-16: qty 1

## 2021-09-16 MED ORDER — ONDANSETRON HCL 4 MG/2ML IJ SOLN
INTRAMUSCULAR | Status: DC | PRN
  Administered 2021-09-16: 4 mg via INTRAVENOUS

## 2021-09-16 MED ORDER — VANCOMYCIN HCL IN DEXTROSE 1-5 GM/200ML-% IV SOLN
1000.0000 mg | INTRAVENOUS | Status: AC
  Administered 2021-09-16: 1000 mg via INTRAVENOUS

## 2021-09-16 MED ORDER — FENTANYL CITRATE (PF) 100 MCG/2ML IJ SOLN
INTRAMUSCULAR | Status: DC | PRN
  Administered 2021-09-16: 50 ug via INTRAVENOUS
  Administered 2021-09-16 (×2): 25 ug via INTRAVENOUS

## 2021-09-16 MED ORDER — OXYCODONE HCL 5 MG/5ML PO SOLN: 5.0000 mg | Freq: Once | ORAL | Status: DC | PRN

## 2021-09-16 MED ORDER — FENTANYL CITRATE (PF) 100 MCG/2ML IJ SOLN
100.0000 ug | Freq: Once | INTRAMUSCULAR | Status: AC
  Administered 2021-09-16: 100 ug via INTRAVENOUS

## 2021-09-16 MED ORDER — ONDANSETRON HCL 4 MG/2ML IJ SOLN
INTRAMUSCULAR | Status: AC
  Filled 2021-09-16: qty 2

## 2021-09-16 MED ORDER — LACTATED RINGERS IV SOLN: INTRAVENOUS | Status: DC | PRN

## 2021-09-16 MED ORDER — PROPOFOL 10 MG/ML IV BOLUS
INTRAVENOUS | Status: AC
  Filled 2021-09-16: qty 20

## 2021-09-16 MED ORDER — ROPIVACAINE HCL 7.5 MG/ML IJ SOLN
INTRAMUSCULAR | Status: DC | PRN
  Administered 2021-09-16: 25 mL via PERINEURAL

## 2021-09-16 MED ORDER — LACTATED RINGERS IV SOLN: INTRAVENOUS | Status: DC

## 2021-09-16 MED ORDER — MEPERIDINE HCL 25 MG/ML IJ SOLN: 6.2500 mg | INTRAMUSCULAR | Status: DC | PRN

## 2021-09-16 MED ORDER — ACETAMINOPHEN 160 MG/5ML PO SOLN: 325.0000 mg | ORAL | Status: DC | PRN

## 2021-09-16 MED ORDER — FENTANYL CITRATE (PF) 100 MCG/2ML IJ SOLN: 25.0000 ug | INTRAMUSCULAR | Status: DC | PRN

## 2021-09-16 MED ORDER — ACETAMINOPHEN 325 MG PO TABS: 325.0000 mg | ORAL_TABLET | ORAL | Status: DC | PRN

## 2021-09-16 MED ORDER — PROPOFOL 10 MG/ML IV BOLUS
INTRAVENOUS | Status: DC | PRN
  Administered 2021-09-16: 200 mg via INTRAVENOUS

## 2021-09-16 MED ORDER — FENTANYL CITRATE (PF) 100 MCG/2ML IJ SOLN
INTRAMUSCULAR | Status: AC
  Filled 2021-09-16: qty 2

## 2021-09-16 MED ORDER — LIDOCAINE HCL (CARDIAC) PF 100 MG/5ML IV SOSY
PREFILLED_SYRINGE | INTRAVENOUS | Status: DC | PRN
  Administered 2021-09-16: 100 mg via INTRAVENOUS

## 2021-09-16 MED ORDER — HYDROCODONE-ACETAMINOPHEN 5-325 MG PO TABS: ORAL_TABLET | ORAL | 0 refills | Status: AC

## 2021-09-16 MED ORDER — MIDAZOLAM HCL 2 MG/2ML IJ SOLN
INTRAMUSCULAR | Status: AC
  Filled 2021-09-16: qty 2

## 2021-09-16 MED ORDER — VANCOMYCIN HCL 1000 MG IV SOLR
INTRAVENOUS | Status: AC
  Filled 2021-09-16: qty 20

## 2021-09-16 MED ORDER — OXYCODONE HCL 5 MG PO TABS: 5.0000 mg | ORAL_TABLET | Freq: Once | ORAL | Status: DC | PRN

## 2021-09-16 MED ORDER — ONDANSETRON HCL 4 MG/2ML IJ SOLN: 4.0000 mg | Freq: Once | INTRAMUSCULAR | Status: DC | PRN

## 2021-09-16 MED ORDER — LIDOCAINE 2% (20 MG/ML) 5 ML SYRINGE
INTRAMUSCULAR | Status: AC
  Filled 2021-09-16: qty 5

## 2021-09-16 MED ORDER — DEXAMETHASONE SODIUM PHOSPHATE 10 MG/ML IJ SOLN
INTRAMUSCULAR | Status: DC | PRN
  Administered 2021-09-16: 10 mg

## 2021-09-16 SURGICAL SUPPLY — 64 items
APL PRP STRL LF DISP 70% ISPRP (MISCELLANEOUS) ×1
BAG DECANTER FOR FLEXI CONT (MISCELLANEOUS) IMPLANT
BLADE MINI RND TIP GREEN BEAV (BLADE) IMPLANT
BLADE SURG 15 STRL LF DISP TIS (BLADE) ×2 IMPLANT
BLADE SURG 15 STRL SS (BLADE) ×4
BNDG CMPR 9X4 STRL LF SNTH (GAUZE/BANDAGES/DRESSINGS)
BNDG ELASTIC 3X5.8 VLCR STR LF (GAUZE/BANDAGES/DRESSINGS) ×1 IMPLANT
BNDG ESMARK 4X9 LF (GAUZE/BANDAGES/DRESSINGS) IMPLANT
BNDG GAUZE DERMACEA FLUFF (GAUZE/BANDAGES/DRESSINGS)
BNDG GAUZE DERMACEA FLUFF 4 (GAUZE/BANDAGES/DRESSINGS) ×1 IMPLANT
BNDG GZE DERMACEA 4 6PLY (GAUZE/BANDAGES/DRESSINGS)
BRUSH SCRUB EZ PLAIN DRY (MISCELLANEOUS) ×2 IMPLANT
CATH ROBINSON RED A/P 10FR (CATHETERS) IMPLANT
CHLORAPREP W/TINT 26 (MISCELLANEOUS) ×2 IMPLANT
CORD BIPOLAR FORCEPS 12FT (ELECTRODE) ×2 IMPLANT
COVER BACK TABLE 60X90IN (DRAPES) ×2 IMPLANT
COVER MAYO STAND STRL (DRAPES) ×2 IMPLANT
CUFF TOURN SGL QUICK 18X4 (TOURNIQUET CUFF) ×1 IMPLANT
DRAPE EXTREMITY T 121X128X90 (DISPOSABLE) ×2 IMPLANT
DRAPE SURG 17X23 STRL (DRAPES) ×2 IMPLANT
GAUZE SPONGE 4X4 12PLY STRL (GAUZE/BANDAGES/DRESSINGS) ×2 IMPLANT
GAUZE XEROFORM 1X8 LF (GAUZE/BANDAGES/DRESSINGS) ×2 IMPLANT
GLOVE BIO SURGEON STRL SZ7.5 (GLOVE) ×2 IMPLANT
GLOVE BIOGEL PI IND STRL 8 (GLOVE) ×1 IMPLANT
GLOVE BIOGEL PI IND STRL 8.5 (GLOVE) IMPLANT
GLOVE BIOGEL PI INDICATOR 8 (GLOVE) ×1
GLOVE BIOGEL PI INDICATOR 8.5 (GLOVE) ×1
GLOVE SURG ORTHO 8.0 STRL STRW (GLOVE) ×1 IMPLANT
GOWN STRL REUS W/ TWL LRG LVL3 (GOWN DISPOSABLE) ×1 IMPLANT
GOWN STRL REUS W/TWL LRG LVL3 (GOWN DISPOSABLE) ×2
GOWN STRL REUS W/TWL XL LVL3 (GOWN DISPOSABLE) ×3 IMPLANT
LOOP VESSEL MAXI BLUE (MISCELLANEOUS) IMPLANT
NDL HYPO 25X1 1.5 SAFETY (NEEDLE) IMPLANT
NDL SAFETY ECLIPSE 18X1.5 (NEEDLE) IMPLANT
NEEDLE HYPO 18GX1.5 SHARP (NEEDLE)
NEEDLE HYPO 25X1 1.5 SAFETY (NEEDLE) IMPLANT
NS IRRIG 1000ML POUR BTL (IV SOLUTION) ×2 IMPLANT
PACK BASIN DAY SURGERY FS (CUSTOM PROCEDURE TRAY) ×2 IMPLANT
PAD CAST 3X4 CTTN HI CHSV (CAST SUPPLIES) ×1 IMPLANT
PAD CAST 4YDX4 CTTN HI CHSV (CAST SUPPLIES) IMPLANT
PADDING CAST ABS 4INX4YD NS (CAST SUPPLIES) ×1
PADDING CAST ABS COTTON 4X4 ST (CAST SUPPLIES) ×1 IMPLANT
PADDING CAST COTTON 3X4 STRL (CAST SUPPLIES) ×2
PADDING CAST COTTON 4X4 STRL (CAST SUPPLIES)
SLEEVE SCD COMPRESS KNEE MED (STOCKING) ×1 IMPLANT
SPEAR EYE SURG WECK-CEL (MISCELLANEOUS) ×2 IMPLANT
SPIKE FLUID TRANSFER (MISCELLANEOUS) ×2 IMPLANT
SPLINT PLASTER CAST XFAST 3X15 (CAST SUPPLIES) IMPLANT
SPLINT PLASTER XTRA FASTSET 3X (CAST SUPPLIES)
STOCKINETTE 4X48 STRL (DRAPES) ×2 IMPLANT
SUT ETHIBOND 3-0 V-5 (SUTURE) IMPLANT
SUT ETHILON 4 0 PS 2 18 (SUTURE) ×2 IMPLANT
SUT FIBERWIRE 4-0 18 TAPR NDL (SUTURE)
SUT NYLON 9 0 VRM6 (SUTURE) IMPLANT
SUT PROLENE 6 0 P 1 18 (SUTURE) IMPLANT
SUT SILK 4 0 PS 2 (SUTURE) IMPLANT
SUT SUPRAMID 4-0 (SUTURE) IMPLANT
SUT VICRYL 4-0 PS2 18IN ABS (SUTURE) IMPLANT
SUTURE FIBERWR 4-0 18 TAPR NDL (SUTURE) IMPLANT
SYR BULB EAR ULCER 3OZ GRN STR (SYRINGE) ×2 IMPLANT
SYR CONTROL 10ML LL (SYRINGE) IMPLANT
TOWEL GREEN STERILE FF (TOWEL DISPOSABLE) ×4 IMPLANT
TRAY DSU PREP LF (CUSTOM PROCEDURE TRAY) IMPLANT
UNDERPAD 30X36 HEAVY ABSORB (UNDERPADS AND DIAPERS) ×2 IMPLANT

## 2021-09-16 NOTE — Op Note (Signed)
I assisted Surgeon(s) and Role:    * Leanora Cover, MD - Primary    Daryll Brod, MD - Assisting on the Procedure(s): LEFT INDEX FINGER EXPLORATION on 09/16/2021.  I provided assistance on this case as follows: setup, approach, retraction, exploration artery and nerve, closure of the wound and application of dressing and splint.    Electronically signed by: Daryll Brod, MD Date: 09/16/2021 Time: 3:11 PM

## 2021-09-16 NOTE — Discharge Instructions (Addendum)

## 2021-09-16 NOTE — Progress Notes (Signed)
EKG completed in pre-op today.  Placed on chart for MD review

## 2021-09-16 NOTE — Anesthesia Procedure Notes (Signed)
Procedure Name: LMA Insertion Date/Time: 09/16/2021 2:51 PM  Performed by: Verita Lamb, CRNAPre-anesthesia Checklist: Patient identified, Emergency Drugs available, Suction available and Patient being monitored Patient Re-evaluated:Patient Re-evaluated prior to induction Oxygen Delivery Method: Circle system utilized Preoxygenation: Pre-oxygenation with 100% oxygen Induction Type: IV induction Ventilation: Mask ventilation without difficulty LMA: LMA inserted LMA Size: 5.0 Number of attempts: 1 Airway Equipment and Method: Bite block Placement Confirmation: positive ETCO2, breath sounds checked- equal and bilateral and CO2 detector Tube secured with: Tape Dental Injury: Teeth and Oropharynx as per pre-operative assessment

## 2021-09-16 NOTE — Anesthesia Preprocedure Evaluation (Addendum)
Anesthesia Evaluation  Patient identified by MRN, date of birth, ID band Patient awake    Reviewed: Allergy & Precautions, H&P , NPO status , Patient's Chart, lab work & pertinent test results, reviewed documented beta blocker date and time   Airway Mallampati: III  TM Distance: >3 FB Neck ROM: full    Dental no notable dental hx. (+) Poor Dentition, Chipped, Missing, Dental Advisory Given,    Pulmonary neg pulmonary ROS,    Pulmonary exam normal breath sounds clear to auscultation       Cardiovascular Exercise Tolerance: Good hypertension, Pt. on medications and Pt. on home beta blockers negative cardio ROS   Rhythm:regular Rate:Normal     Neuro/Psych  Headaches, negative psych ROS   GI/Hepatic Neg liver ROS, GERD  ,  Endo/Other  negative endocrine ROS  Renal/GU Renal disease  negative genitourinary   Musculoskeletal   Abdominal   Peds  Hematology negative hematology ROS (+)   Anesthesia Other Findings   Reproductive/Obstetrics negative OB ROS                           Anesthesia Physical Anesthesia Plan  ASA: 3  Anesthesia Plan: General and Regional   Post-op Pain Management: Minimal or no pain anticipated and Regional block*   Induction: Intravenous  PONV Risk Score and Plan: 1 and Ondansetron and Treatment may vary due to age or medical condition  Airway Management Planned: LMA  Additional Equipment: None  Intra-op Plan:   Post-operative Plan:   Informed Consent: I have reviewed the patients History and Physical, chart, labs and discussed the procedure including the risks, benefits and alternatives for the proposed anesthesia with the patient or authorized representative who has indicated his/her understanding and acceptance.     Dental Advisory Given  Plan Discussed with: CRNA and Anesthesiologist  Anesthesia Plan Comments:       Anesthesia Quick Evaluation

## 2021-09-16 NOTE — Anesthesia Postprocedure Evaluation (Signed)
Anesthesia Post Note  Patient: ROBEN SCHLIEP  Procedure(s) Performed: LEFT INDEX FINGER EXPLORATION (Left: Hand)     Patient location during evaluation: PACU Anesthesia Type: Regional and General Level of consciousness: awake and alert Pain management: pain level controlled Vital Signs Assessment: post-procedure vital signs reviewed and stable Respiratory status: spontaneous breathing, nonlabored ventilation, respiratory function stable and patient connected to nasal cannula oxygen Cardiovascular status: blood pressure returned to baseline and stable Postop Assessment: no apparent nausea or vomiting Anesthetic complications: no   No notable events documented.  Last Vitals:  Vitals:   09/16/21 1600 09/16/21 1644  BP: (!) 148/87 (!) 164/91  Pulse: 75 64  Resp: 15 16  Temp:  36.4 C  SpO2: 94% 94%    Last Pain:  Vitals:   09/16/21 1644  TempSrc:   PainSc: 0-No pain                 Lynelle Weiler

## 2021-09-16 NOTE — Op Note (Addendum)
NAME: Wesley Henson [facility] MEDICAL RECORD NO: [id number] DATE OF BIRTH: Oct 19, 1970 FACILITY: [facility] LOCATION: [facility] PHYSICIAN: Tennis Must, MD   OPERATIVE REPORT   DATE OF PROCEDURE: 09/16/21    PREOPERATIVE DIAGNOSIS: Left index finger laceration with possible nerve and artery laceration   POSTOPERATIVE DIAGNOSIS: Left index finger laceration   PROCEDURE: Exploration penetrating wound left index finger   SURGEON:  Leanora Cover, M.D.   ASSISTANT: Daryll Brod, MD   ANESTHESIA:  General with regional   INTRAVENOUS FLUIDS:  Per anesthesia flow sheet.   ESTIMATED BLOOD LOSS:  Minimal.   COMPLICATIONS:  None.   SPECIMENS:  none   TOURNIQUET TIME:    Total Tourniquet Time Documented: Upper Arm (Left) - 12 minutes Total: Upper Arm (Left) - 12 minutes    DISPOSITION:  Stable to PACU.   INDICATIONS: 51 year old male states he sustained laceration to left index finger last week from a box cutter.  He has had decreased sensation at the radial side of the index finger.  He wishes to proceed with operative exploration with repair of tendon artery nerve is necessary.  Risks, benefits and alternatives of surgery were discussed including the risks of blood loss, infection, damage to nerves, vessels, tendons, ligaments, bone for surgery, need for additional surgery, complications with wound healing, continued pain, stiffness.  He voiced understanding of these risks and elected to proceed.  OPERATIVE COURSE:  After being identified preoperatively by myself,  the patient and I agreed on the procedure and site of the procedure.  The surgical site was marked.  Surgical consent had been signed. Preoperative IV antibiotic prophylaxis was given. He was transferred to the operating room and placed on the operating table in supine position with the Left upper extremity on an arm board.  General anesthesia was induced by the anesthesiologist. A regional block had been performed by  anesthesia in preoperative holding.    Left upper extremity was prepped and draped in normal sterile orthopedic fashion.  A surgical pause was performed between the surgeons, anesthesia, and operating room staff and all were in agreement as to the patient, procedure, and site of procedure.  Tourniquet at the proximal aspect of the extremity was inflated to 250 mmHg after exsanguination of the arm with an Esmarch bandage.  Sutures removed from the wound.  The wound was opened and examined.  The extensor tendon was identified and was intact.  The wound was extended along the volar surface of the finger to aid in visualization.  The radial digital nerve and artery were identified and were intact.  The flexor tendon was outside the zone of injury.  The wound was copiously irrigated with sterile saline.  It was then closed with 4-0 nylon in a horizontal mattress fashion.  It was dressed with sterile Xeroform and 4 x 4 and wrapped with a Coban dressing lightly.  An AlumaFoam splint was placed and wrapped lightly with Coban dressing.  The tourniquet was deflated at 12 minutes.  Fingertips were pink with brisk capillary refill after deflation of tourniquet.  The operative  drapes were broken down.  The patient was awoken from anesthesia safely.  He was transferred back to the stretcher and taken to PACU in stable condition.  I will see him back in the office in 1 week for postoperative followup.  I will give him a prescription for Norco 5/325 1 tab PO q6 hours prn pain, dispense # 10.   Leanora Cover, MD Electronically signed, 09/16/21

## 2021-09-16 NOTE — Anesthesia Procedure Notes (Signed)
Anesthesia Regional Block: Ankle block   Pre-Anesthetic Checklist: , timeout performed,  Correct Patient, Correct Site, Correct Laterality,  Correct Procedure, Correct Position, site marked,  Risks and benefits discussed,  Surgical consent,  Pre-op evaluation,  At surgeon's request and post-op pain management  Laterality: Left  Prep: chloraprep       Needles:  Injection technique: Single-shot  Needle Type: Echogenic Stimulator Needle     Needle Length: 5cm  Needle Gauge: 22     Additional Needles:   Procedures:, nerve stimulator,,, ultrasound used (permanent image in chart),,    Narrative:  Start time: 09/16/2021 1:22 PM End time: 09/16/2021 1:27 PM Injection made incrementally with aspirations every 5 mL.  Performed by: Personally  Anesthesiologist: Janeece Riggers, MD  Additional Notes: Functioning IV was confirmed and monitors were applied.  A 59m 22ga Arrow echogenic stimulator needle was used. Sterile prep and drape,hand hygiene and sterile gloves were used. Ultrasound guidance: relevant anatomy identified, needle position confirmed, local anesthetic spread visualized around nerve(s)., vascular puncture avoided.  Image printed for medical record. Negative aspiration and negative test dose prior to incremental administration of local anesthetic. The patient tolerated the procedure well.

## 2021-09-16 NOTE — Transfer of Care (Signed)
Immediate Anesthesia Transfer of Care Note  Patient: Wesley Henson  Procedure(s) Performed: LEFT INDEX FINGER EXPLORATION (Left: Hand)  Patient Location: PACU  Anesthesia Type:General and Regional  Level of Consciousness: awake and alert   Airway & Oxygen Therapy: Patient Spontanous Breathing  Post-op Assessment: Report given to RN and Post -op Vital signs reviewed and stable  Post vital signs: Reviewed and stable  Last Vitals:  Vitals Value Taken Time  BP 135/74 09/16/21 1517  Temp    Pulse 67 09/16/21 1519  Resp 13 09/16/21 1519  SpO2 91 % 09/16/21 1519  Vitals shown include unvalidated device data.  Last Pain:  Vitals:   09/16/21 1224  TempSrc: Oral  PainSc: 2          Complications: No notable events documented.

## 2021-09-16 NOTE — H&P (Signed)
Wesley Henson is an 51 y.o. male.   Chief Complaint: finger laceration HPI: 51 yo male states he sustained laceration to left index finger from box cutter last week.  Has numbness of portion of the digit.  He wishes to have operative exploration with repair tendon/artery/nerve as necessary.  Allergies:  Allergies  Allergen Reactions   Cephalexin Hives   Penicillins Anaphylaxis    Did it involve swelling of the face/tongue/throat, SOB, or low BP? No Did it involve sudden or severe rash/hives, skin peeling, or any reaction on the inside of your mouth or nose? No Did you need to seek medical attention at a hospital or doctor's office? No When did it last happen?       If all above answers are "NO", may proceed with cephalosporin use.    Sulfa Antibiotics     Bactrim: Fixed drug eruption, Skin necrosis   Lisinopril Cough    Past Medical History:  Diagnosis Date   ADD (attention deficit disorder with hyperactivity)    Cataract    Diverticula of colon    complicated by abscess, since 03/2011   Diverticulitis of large intestine with perforation 08/18/2011   GERD (gastroesophageal reflux disease)    Hyperlipidemia    Hypertension    Kidney calculi    Migraine    Penicillin allergy    History of anaphylaxis.   Sleep apnea    Thrombocytosis 08/23/2011   Tuberculosis    Ureterolithiasis 08/18/2011    Past Surgical History:  Procedure Laterality Date   CATARACT EXTRACTION Left 02/2021   COLON SURGERY     COLONOSCOPY     LITHOTRIPSY     WISDOM TOOTH EXTRACTION      Family History: Family History  Problem Relation Age of Onset   Diverticulitis Mother    Diabetes Father    Kidney disease Father    Esophageal cancer Maternal Grandfather    Colon cancer Paternal Grandmother    Pancreatic cancer Neg Hx    Stomach cancer Neg Hx    Colon polyps Neg Hx     Social History:   reports that he has never smoked. He has never used smokeless tobacco. He reports current alcohol  use. He reports that he does not use drugs.  Medications: Medications Prior to Admission  Medication Sig Dispense Refill   losartan (COZAAR) 50 MG tablet Take 100 mg by mouth daily.      metoprolol succinate (TOPROL-XL) 50 MG 24 hr tablet Take 50 mg by mouth daily. Take with or immediately following a meal.     omeprazole (PRILOSEC) 20 MG capsule Take 20 mg by mouth daily.     tadalafil (CIALIS) 5 MG tablet Take 5 mg by mouth daily.     albuterol (PROVENTIL HFA;VENTOLIN HFA) 108 (90 BASE) MCG/ACT inhaler Inhale 1-2 puffs into the lungs every 6 (six) hours as needed for wheezing or shortness of breath.     dexlansoprazole (DEXILANT) 60 MG capsule Take 1 capsule by mouth once daily 30 capsule 2   methocarbamol (ROBAXIN) 750 MG tablet Take 1 tablet by mouth 2 (two) times daily as needed.     SUMAtriptan (IMITREX) 50 MG tablet Take 50 mg by mouth every 2 (two) hours as needed for migraine or headache. May repeat in 2 hours if headache persists or recurs.      No results found for this or any previous visit (from the past 48 hour(s)).  No results found.    Blood pressure (!) 170/102,  pulse 75, temperature 97.8 F (36.6 C), temperature source Oral, resp. rate 18, height '6\' 3"'$  (1.905 m), weight 122 kg, SpO2 100 %.  General appearance: alert, cooperative, and appears stated age Head: Normocephalic, without obvious abnormality, atraumatic Neck: supple, symmetrical, trachea midline Extremities: Intact sensation and capillary refill all digits except left index finger with decreased sensation.  +epl/fpl/io.  Laceration to radial side of left index finger Pulses: 2+ and symmetric Skin: Skin color, texture, turgor normal. No rashes or lesions Neurologic: Grossly normal Incision/Wound: as above  Assessment/Plan Left index finger laceration with possible tendon/artery/nerve laceration.  Plan exploration laceration with repair endon/artery/nerve as necessary.  Non operative and operative treatment  options have been discussed with the patient and patient wishes to proceed with operative treatment. Risks, benefits, and alternatives of surgery have been discussed and the patient agrees with the plan of care.   Leanora Cover 09/16/2021, 12:50 PM

## 2021-09-16 NOTE — Progress Notes (Signed)
Assisted Dr. Ambrose Pancoast with left, axillary, ultrasound guided block. Side rails up, monitors on throughout procedure. See vital signs in flow sheet. Tolerated Procedure well.

## 2021-09-17 ENCOUNTER — Encounter (HOSPITAL_BASED_OUTPATIENT_CLINIC_OR_DEPARTMENT_OTHER): Payer: Self-pay | Admitting: Orthopedic Surgery

## 2021-10-01 ENCOUNTER — Other Ambulatory Visit (HOSPITAL_COMMUNITY): Payer: Self-pay

## 2021-10-01 NOTE — Telephone Encounter (Signed)
Prior Authorization for DEXILANT '60mg'$  is still listed as pending.   Test claims indicate that the following medications ARE covered by insurance and would NOT require prior authorization.  Pantoprazole '40mg'$  Lansoprazole '30mg'$  Rabeprazole '30mg'$  Esomeprazole '20mg'$    Clista Bernhardt, CPhT Rx Patient Advocate

## 2021-11-11 ENCOUNTER — Other Ambulatory Visit (HOSPITAL_COMMUNITY): Payer: Self-pay

## 2021-11-11 NOTE — Telephone Encounter (Signed)
I am confused, what is the denial reason?  It just says that there are other medications that do not require prior authorization.  He has been previously trialed on omeprazole (ineffective at high dose), pantoprazole (not efficacious), and has also added Carafate and famotidine in the past.  If the Dexilant is not going to be covered, I recommend the following: Rabeprazole 30 mg daily.  Take 30-60 minutes prior to breakfast.  #90, RF 3.  If 30 mg/day is not efficacious, will escalate to 20 mg bid x6 weeks, then can hopefully reduce dosing.  If continued breakthrough, I do not see any reason that his insurance could further deny Dexilant.

## 2021-11-12 ENCOUNTER — Other Ambulatory Visit (HOSPITAL_COMMUNITY): Payer: Self-pay

## 2021-11-12 ENCOUNTER — Other Ambulatory Visit: Payer: Self-pay

## 2021-11-12 DIAGNOSIS — K219 Gastro-esophageal reflux disease without esophagitis: Secondary | ICD-10-CM

## 2021-11-12 MED ORDER — RABEPRAZOLE SODIUM 20 MG PO TBEC: 20.0000 mg | DELAYED_RELEASE_TABLET | Freq: Every day | ORAL | 3 refills | Status: DC

## 2021-11-12 NOTE — Telephone Encounter (Signed)
My apologies on this not being properly routed initially. Pt plan cancelled the PA due to pt not try/fail the listed formulary medications. If all alternatives fail, then PA can be resubmitted for Dexilant.

## 2022-11-02 ENCOUNTER — Other Ambulatory Visit: Payer: Self-pay

## 2022-11-02 ENCOUNTER — Emergency Department (HOSPITAL_COMMUNITY)
Admission: EM | Admit: 2022-11-02 | Discharge: 2022-11-02 | Disposition: A | Discharge status: Home / Self Care | Payer: BC Managed Care – PPO | Attending: Emergency Medicine | Admitting: Emergency Medicine

## 2022-11-02 DIAGNOSIS — Z79899 Other long term (current) drug therapy: Secondary | ICD-10-CM | POA: Insufficient documentation

## 2022-11-02 DIAGNOSIS — R8290 Unspecified abnormal findings in urine: Secondary | ICD-10-CM | POA: Insufficient documentation

## 2022-11-02 DIAGNOSIS — I10 Essential (primary) hypertension: Secondary | ICD-10-CM | POA: Diagnosis not present

## 2022-11-02 LAB — URINALYSIS, ROUTINE W REFLEX MICROSCOPIC
Bacteria, UA: NONE SEEN
Bilirubin Urine: NEGATIVE
Glucose, UA: NEGATIVE mg/dL
Ketones, ur: NEGATIVE mg/dL
Leukocytes,Ua: NEGATIVE
Nitrite: NEGATIVE
Protein, ur: NEGATIVE mg/dL
Specific Gravity, Urine: 1.009 (ref 1.005–1.030)
pH: 5 (ref 5.0–8.0)

## 2022-11-02 LAB — BASIC METABOLIC PANEL
Anion gap: 9 (ref 5–15)
BUN: 17 mg/dL (ref 6–20)
CO2: 25 mmol/L (ref 22–32)
Calcium: 9.3 mg/dL (ref 8.9–10.3)
Chloride: 103 mmol/L (ref 98–111)
Creatinine, Ser: 1.27 mg/dL — ABNORMAL HIGH (ref 0.61–1.24)
GFR, Estimated: >60 mL/min (ref >60)
Glucose, Bld: 105 mg/dL — ABNORMAL HIGH (ref 70–99)
Potassium: 4 mmol/L (ref 3.5–5.1)
Sodium: 137 mmol/L (ref 135–145)

## 2022-11-02 LAB — CBC WITH DIFFERENTIAL/PLATELET
Abs Immature Granulocytes: 0.03 10*3/uL (ref 0.00–0.07)
Basophils Absolute: 0 10*3/uL (ref 0.0–0.1)
Basophils Relative: 1 %
Eosinophils Absolute: 0.2 10*3/uL (ref 0.0–0.5)
Eosinophils Relative: 2 %
HCT: 42.6 % (ref 39.0–52.0)
Hemoglobin: 14.1 g/dL (ref 13.0–17.0)
Immature Granulocytes: 0 %
Lymphocytes Relative: 26 %
Lymphs Abs: 2.3 10*3/uL (ref 0.7–4.0)
MCH: 29.4 pg (ref 26.0–34.0)
MCHC: 33.1 g/dL (ref 30.0–36.0)
MCV: 88.8 fL (ref 80.0–100.0)
Monocytes Absolute: 0.8 10*3/uL (ref 0.1–1.0)
Monocytes Relative: 9 %
Neutro Abs: 5.5 10*3/uL (ref 1.7–7.7)
Neutrophils Relative %: 62 %
Platelets: 320 10*3/uL (ref 150–400)
RBC: 4.8 MIL/uL (ref 4.22–5.81)
RDW: 13.1 % (ref 11.5–15.5)
WBC: 8.8 10*3/uL (ref 4.0–10.5)
nRBC: 0 % (ref 0.0–0.2)

## 2022-11-02 LAB — CK: Total CK: 132 U/L (ref 49–397)

## 2022-11-02 NOTE — ED Triage Notes (Signed)
Pt presents with malaise and dark urine, per pt he did some usual hitting lifting yesterday and believes he has rhado.

## 2022-11-02 NOTE — Discharge Instructions (Signed)
Drink plenty of fluids and get plenty of rest.  Follow-up with primary doctor and return to the ER if your symptoms significantly worsen or change.

## 2022-11-02 NOTE — ED Provider Notes (Signed)
[place] EMERGENCY DEPARTMENT AT [facility] Provider Note   CSN: [id number] Arrival date & time: 11/02/22  1551     History  Chief Complaint  Patient presents with   Fatigue    KALIF KENTNER is a 52 y.o. male.  Patient is a 52 year old male with past medical history of kidney stones, hypertension, migraines, GERD, hyperlipidemia.  Patient presenting today with complaints of discolored urine.  2 days ago he was shoveling and feeling sandbags to help prepare his mother's house for the upcoming storm.  Today he began with muscle soreness, then urinated and noticed that it was brown in color.  He was concerned about the possibility of rhabdomyolysis.  Since this episode of discolored urine, his urine has now gone back to normal.  He denies any fevers or chills.  He does have a history of kidney stones, but denies any flank or abdominal pain.  The history is provided by the patient.       Home Medications Prior to Admission medications   Medication Sig Start Date End Date Taking? Authorizing Provider  albuterol (PROVENTIL HFA;VENTOLIN HFA) 108 (90 BASE) MCG/ACT inhaler Inhale 1-2 puffs into the lungs every 6 (six) hours as needed for wheezing or shortness of breath.    [provider]  dexlansoprazole (DEXILANT) 60 MG capsule Take 1 capsule by mouth once daily 07/29/21   Zehr, Princella Pellegrini, PA-C  HYDROcodone-acetaminophen (NORCO/VICODIN) 5-325 MG tablet 1 tab PO q6 hours prn pain 09/16/21   Betha Loa, MD  losartan (COZAAR) 50 MG tablet Take 100 mg by mouth daily.     [provider]  methocarbamol (ROBAXIN) 750 MG tablet Take 1 tablet by mouth 2 (two) times daily as needed. 07/20/20   [provider]  metoprolol succinate (TOPROL-XL) 50 MG 24 hr tablet Take 50 mg by mouth daily. Take with or immediately following a meal.    [provider]  omeprazole (PRILOSEC) 20 MG capsule Take 20 mg by mouth daily.    [provider]  RABEprazole  (ACIPHEX) 20 MG tablet Take 1 tablet (20 mg total) by mouth daily. Take one tablet 30-60 minutes before breakfast. 11/12/21   Cirigliano, Vito V, DO  SUMAtriptan (IMITREX) 50 MG tablet Take 50 mg by mouth every 2 (two) hours as needed for migraine or headache. May repeat in 2 hours if headache persists or recurs.    [provider]  tadalafil (CIALIS) 5 MG tablet Take 5 mg by mouth daily. 07/14/20   [provider]      Allergies    Cephalexin, Penicillins, Sulfa antibiotics, Bactrim [sulfamethoxazole-trimethoprim], and Lisinopril    Review of Systems   Review of Systems  All other systems reviewed and are negative.   Physical Exam Updated Vital Signs BP 122/84   Pulse 75   Temp 98.6 F (37 C) (Oral)   Resp 18   Ht 6\' 3"  (1.905 m)   Wt 123.4 kg   SpO2 95%   BMI 34.00 kg/m  Physical Exam Vitals and nursing note reviewed.  Constitutional:      General: He is not in acute distress.    Appearance: He is well-developed. He is not diaphoretic.  HENT:     Head: Normocephalic and atraumatic.  Cardiovascular:     Rate and Rhythm: Normal rate and regular rhythm.     Heart sounds: No murmur heard.    No friction rub.  Pulmonary:     Effort: Pulmonary effort is normal. No  respiratory distress.     Breath sounds: Normal breath sounds. No wheezing or rales.  Abdominal:     General: Bowel sounds are normal. There is no distension.     Palpations: Abdomen is soft.     Tenderness: There is no abdominal tenderness.  Musculoskeletal:        General: Normal range of motion.     Cervical back: Normal range of motion and neck supple.  Skin:    General: Skin is warm and dry.  Neurological:     Mental Status: He is alert and oriented to person, place, and time.     Coordination: Coordination normal.     ED Results / Procedures / Treatments   Labs (all labs ordered are listed, but only abnormal results are displayed) Labs Reviewed  BASIC METABOLIC PANEL - Abnormal;  Notable for the following components:      Result Value   Glucose, Bld 105 (*)    Creatinine, Ser 1.27 (*)    All other components within normal limits  URINALYSIS, ROUTINE W REFLEX MICROSCOPIC - Abnormal; Notable for the following components:   Color, Urine STRAW (*)    Hgb urine dipstick SMALL (*)    All other components within normal limits  CBC WITH DIFFERENTIAL/PLATELET  CK    EKG None  Radiology No results found.  Procedures Procedures    Medications Ordered in ED Medications - No data to display  ED Course/ Medical Decision Making/ A&P  Patient is a 52 year old male presenting with complaints of discolored urine as described in the HPI.  He arrives here with stable vital signs and physical examination which is unremarkable.  Laboratory studies obtained including CBC, metabolic panel, and total CK.  There is a mild elevation of his creatinine of 1.27, but total CK is 132.  Urinalysis reveals straw-colored urine with small hemoglobin, but no other evidence for infection.  There was no frank hematuria.  Patient observed here for several hours due to prolonged wait times.  He reports urinating normally.  Care discussed with the patient who is comfortable with going home.  I will advise him to increase his fluid intake for the next 2 days and see what happens.  He is to return if symptoms worsen.  Workup today is inconsistent with rhabdomyolysis.  Final Clinical Impression(s) / ED Diagnoses Final diagnoses:  None    Rx / DC Orders ED Discharge Orders     None         Geoffery Lyons, MD 11/02/22 2318

## 2022-12-07 ENCOUNTER — Other Ambulatory Visit: Payer: Self-pay | Admitting: Gastroenterology

## 2023-03-04 ENCOUNTER — Other Ambulatory Visit: Payer: Self-pay | Admitting: Gastroenterology

## 2023-04-10 ENCOUNTER — Other Ambulatory Visit: Payer: Self-pay | Admitting: Gastroenterology
